# Patient Record
Sex: Male | Born: 1980
Health system: Southern US, Community
[De-identification: ages and names within clinical notes are randomized; demographics above are authoritative.]

## PROBLEM LIST (undated history)

## (undated) DIAGNOSIS — K56609 Unspecified intestinal obstruction, unspecified as to partial versus complete obstruction: Secondary | ICD-10-CM

## (undated) HISTORY — PX: BOWEL RESECTION: SHX1257

## (undated) HISTORY — PX: OTHER SURGICAL HISTORY: SHX169

## (undated) HISTORY — PX: KNEE ARTHROSCOPY: SUR90

## (undated) HISTORY — PX: ABDOMINAL SURGERY: SHX537

---

## 2001-04-10 ENCOUNTER — Emergency Department (HOSPITAL_COMMUNITY): Admission: EM | Admit: 2001-04-10 | Discharge: 2001-04-10 | Payer: Self-pay | Admitting: *Deleted

## 2001-04-10 ENCOUNTER — Encounter: Payer: Self-pay | Admitting: *Deleted

## 2001-08-05 ENCOUNTER — Emergency Department (HOSPITAL_COMMUNITY): Admission: EM | Admit: 2001-08-05 | Discharge: 2001-08-05 | Payer: Self-pay | Admitting: Emergency Medicine

## 2001-09-29 ENCOUNTER — Emergency Department (HOSPITAL_COMMUNITY): Admission: EM | Admit: 2001-09-29 | Discharge: 2001-09-29 | Payer: Self-pay | Admitting: Emergency Medicine

## 2002-02-12 ENCOUNTER — Emergency Department (HOSPITAL_COMMUNITY): Admission: EM | Admit: 2002-02-12 | Discharge: 2002-02-12 | Payer: Self-pay | Admitting: *Deleted

## 2002-02-12 ENCOUNTER — Encounter: Payer: Self-pay | Admitting: *Deleted

## 2002-04-29 ENCOUNTER — Emergency Department (HOSPITAL_COMMUNITY): Admission: EM | Admit: 2002-04-29 | Discharge: 2002-04-29 | Payer: Self-pay | Admitting: Emergency Medicine

## 2002-08-05 ENCOUNTER — Encounter: Payer: Self-pay | Admitting: Emergency Medicine

## 2002-08-05 ENCOUNTER — Emergency Department (HOSPITAL_COMMUNITY): Admission: EM | Admit: 2002-08-05 | Discharge: 2002-08-05 | Payer: Self-pay | Admitting: Emergency Medicine

## 2002-11-19 ENCOUNTER — Emergency Department (HOSPITAL_COMMUNITY): Admission: EM | Admit: 2002-11-19 | Discharge: 2002-11-19 | Payer: Self-pay | Admitting: Emergency Medicine

## 2002-12-02 ENCOUNTER — Emergency Department (HOSPITAL_COMMUNITY): Admission: EM | Admit: 2002-12-02 | Discharge: 2002-12-02 | Payer: Self-pay | Admitting: Emergency Medicine

## 2003-02-11 ENCOUNTER — Encounter: Payer: Self-pay | Admitting: Emergency Medicine

## 2003-02-11 ENCOUNTER — Emergency Department (HOSPITAL_COMMUNITY): Admission: EM | Admit: 2003-02-11 | Discharge: 2003-02-11 | Payer: Self-pay | Admitting: Internal Medicine

## 2003-03-29 ENCOUNTER — Emergency Department (HOSPITAL_COMMUNITY): Admission: EM | Admit: 2003-03-29 | Discharge: 2003-03-29 | Payer: Self-pay | Admitting: Internal Medicine

## 2004-07-04 ENCOUNTER — Inpatient Hospital Stay (HOSPITAL_COMMUNITY): Admission: EM | Admit: 2004-07-04 | Discharge: 2004-07-08 | Payer: Self-pay | Admitting: Emergency Medicine

## 2004-08-08 ENCOUNTER — Emergency Department (HOSPITAL_COMMUNITY): Admission: EM | Admit: 2004-08-08 | Discharge: 2004-08-09 | Payer: Self-pay | Admitting: Emergency Medicine

## 2004-11-13 ENCOUNTER — Emergency Department (HOSPITAL_COMMUNITY): Admission: EM | Admit: 2004-11-13 | Discharge: 2004-11-13 | Payer: Self-pay | Admitting: Emergency Medicine

## 2006-07-24 ENCOUNTER — Emergency Department (HOSPITAL_COMMUNITY): Admission: EM | Admit: 2006-07-24 | Discharge: 2006-07-24 | Payer: Self-pay | Admitting: Emergency Medicine

## 2007-10-26 ENCOUNTER — Emergency Department (HOSPITAL_COMMUNITY): Admission: EM | Admit: 2007-10-26 | Discharge: 2007-10-26 | Payer: Self-pay | Admitting: Emergency Medicine

## 2007-10-27 ENCOUNTER — Emergency Department (HOSPITAL_COMMUNITY): Admission: EM | Admit: 2007-10-27 | Discharge: 2007-10-27 | Payer: Self-pay | Admitting: Emergency Medicine

## 2011-01-16 NOTE — Consult Note (Signed)
Gordon [name], Gordon [name]           ACCOUNT NO.:  [id number]   MEDICAL RECORD NO.:  [id number]          PATIENT TYPE:  INP   LOCATION:  A312                          FACILITY:  [facility]   PHYSICIAN:  Scott A. Gerda Diss, MD    DATE OF BIRTH:  [birth year]   DATE OF CONSULTATION:  DATE OF DISCHARGE:  07/08/2004                                   CONSULTATION   DISCHARGE DIAGNOSES:  1.  Viral pneumonia.  2.  Hypoxemia.  3.  Asthma.   HOSPITAL COURSE:  A 30 year old black male with previous good history.  Was  admitted in with cough, congestion, wheezing, shortness of breath.  His O2  saturations were in the 80s.  He required oxygen.  Chest x-ray did show mild  what appeared to be most likely bilateral pneumonia consistent with  bilateral pneumonia.  His blood work showed a white count of 5.4, sodium  134, pO2 50, pCO2 47 on a blood gas.  He was given frequent nebulizer  treatments.  He was also placed on antibiotics.  CT scan of the chest was  done because a D-dimer was elevated but no pulmonary embolus was seen.  Patient gradually improved and on the 8th his O2 saturations were in good  range on room air.  He was discharged home on prednisone taper, albuterol  two puffs q.4h.  He was to follow up in one to two weeks and antibiotic  samples were given from the office.     [name]   SAL/MEDQ  D:  07/31/2004  T:  07/31/2004  Job:  [id number]

## 2011-01-16 NOTE — Group Therapy Note (Signed)
NAMESHANON, BECVAR           ACCOUNT NO.:  [id number]   MEDICAL RECORD NO.:  [id number]          PATIENT TYPE:  INP   LOCATION:  A312                          FACILITY:  [facility]   PHYSICIAN:  Scott A. Gerda Diss, MD    DATE OF BIRTH:  19-Apr-1981   DATE OF PROCEDURE:  DATE OF DISCHARGE:                                   PROGRESS NOTE   The patient is breathing better today.  Less coughing.  Feels more  energetic.  We will check an O2 saturation on room air.  Overall he is doing  well.  His lungs sound pretty good except for some scattered wheezing.  Expect for him to be able to be discharged home later today if all doing  well.     [name]   SAL/MEDQ  D:  07/07/2004  T:  07/07/2004  Job:  [id number]
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
## 2015-12-21 ENCOUNTER — Emergency Department (HOSPITAL_COMMUNITY): Payer: Self-pay

## 2015-12-21 ENCOUNTER — Observation Stay (HOSPITAL_COMMUNITY)
Admission: EM | Admit: 2015-12-21 | Discharge: 2015-12-22 | Disposition: A | Discharge status: Home / Self Care | Payer: Self-pay | Attending: Internal Medicine | Admitting: Internal Medicine

## 2015-12-21 ENCOUNTER — Encounter (HOSPITAL_COMMUNITY): Payer: Self-pay | Admitting: *Deleted

## 2015-12-21 DIAGNOSIS — K56609 Unspecified intestinal obstruction, unspecified as to partial versus complete obstruction: Secondary | ICD-10-CM

## 2015-12-21 DIAGNOSIS — E876 Hypokalemia: Secondary | ICD-10-CM

## 2015-12-21 DIAGNOSIS — K5660 Unspecified intestinal obstruction: Principal | ICD-10-CM | POA: Insufficient documentation

## 2015-12-21 DIAGNOSIS — Z791 Long term (current) use of non-steroidal anti-inflammatories (NSAID): Secondary | ICD-10-CM | POA: Insufficient documentation

## 2015-12-21 DIAGNOSIS — Z87891 Personal history of nicotine dependence: Secondary | ICD-10-CM | POA: Insufficient documentation

## 2015-12-21 DIAGNOSIS — K5669 Other intestinal obstruction: Secondary | ICD-10-CM

## 2015-12-21 LAB — COMPREHENSIVE METABOLIC PANEL
ALT: 167 U/L — ABNORMAL HIGH (ref 17–63)
AST: 76 U/L — AB (ref 15–41)
Albumin: 3.7 g/dL (ref 3.5–5.0)
Alkaline Phosphatase: 71 U/L (ref 38–126)
Anion gap: 10 (ref 5–15)
BUN: 8 mg/dL (ref 6–20)
CHLORIDE: 100 mmol/L — AB (ref 101–111)
CO2: 26 mmol/L (ref 22–32)
Calcium: 8.5 mg/dL — ABNORMAL LOW (ref 8.9–10.3)
Creatinine, Ser: 0.98 mg/dL (ref 0.61–1.24)
Glucose, Bld: 143 mg/dL — ABNORMAL HIGH (ref 65–99)
POTASSIUM: 2.9 mmol/L — AB (ref 3.5–5.1)
Sodium: 136 mmol/L (ref 135–145)
Total Bilirubin: 1 mg/dL (ref 0.3–1.2)
Total Protein: 8 g/dL (ref 6.5–8.1)

## 2015-12-21 LAB — CBC WITH DIFFERENTIAL/PLATELET
Basophils Absolute: 0.1 10*3/uL (ref 0.0–0.1)
Basophils Relative: 1 %
EOS PCT: 2 %
Eosinophils Absolute: 0.2 10*3/uL (ref 0.0–0.7)
HEMATOCRIT: 43.7 % (ref 39.0–52.0)
Hemoglobin: 15.3 g/dL (ref 13.0–17.0)
LYMPHS ABS: 1.6 10*3/uL (ref 0.7–4.0)
LYMPHS PCT: 19 %
MCH: 28.3 pg (ref 26.0–34.0)
MCHC: 35 g/dL (ref 30.0–36.0)
MCV: 80.9 fL (ref 78.0–100.0)
MONO ABS: 1.5 10*3/uL — AB (ref 0.1–1.0)
MONOS PCT: 17 %
NEUTROS ABS: 5.3 10*3/uL (ref 1.7–7.7)
Neutrophils Relative %: 61 %
PLATELETS: 217 10*3/uL (ref 150–400)
RBC: 5.4 MIL/uL (ref 4.22–5.81)
RDW: 12.8 % (ref 11.5–15.5)
WBC: 8.6 10*3/uL (ref 4.0–10.5)

## 2015-12-21 LAB — URINALYSIS, ROUTINE W REFLEX MICROSCOPIC
Bilirubin Urine: NEGATIVE
Glucose, UA: NEGATIVE mg/dL
Hgb urine dipstick: NEGATIVE
Ketones, ur: NEGATIVE mg/dL
LEUKOCYTES UA: NEGATIVE
Nitrite: NEGATIVE
Specific Gravity, Urine: 1.02 (ref 1.005–1.030)
pH: 6 (ref 5.0–8.0)

## 2015-12-21 LAB — MAGNESIUM: MAGNESIUM: 1.7 mg/dL (ref 1.7–2.4)

## 2015-12-21 LAB — URINE MICROSCOPIC-ADD ON

## 2015-12-21 LAB — LIPASE, BLOOD: LIPASE: 22 U/L (ref 11–51)

## 2015-12-21 MED ORDER — SENNOSIDES-DOCUSATE SODIUM 8.6-50 MG PO TABS: 1.0000 | ORAL_TABLET | Freq: Every evening | ORAL | Status: DC | PRN

## 2015-12-21 MED ORDER — ENOXAPARIN SODIUM 60 MG/0.6ML ~~LOC~~ SOLN
60.0000 mg | SUBCUTANEOUS | Status: DC
  Administered 2015-12-21: 60 mg via SUBCUTANEOUS
  Filled 2015-12-21 (×2): qty 0.6

## 2015-12-21 MED ORDER — POTASSIUM CHLORIDE 10 MEQ/100ML IV SOLN
10.0000 meq | INTRAVENOUS | Status: AC
  Administered 2015-12-21 (×5): 10 meq via INTRAVENOUS
  Filled 2015-12-21: qty 100

## 2015-12-21 MED ORDER — LOPERAMIDE HCL 2 MG PO CAPS
4.0000 mg | ORAL_CAPSULE | Freq: Once | ORAL | Status: AC
  Administered 2015-12-21: 4 mg via ORAL
  Filled 2015-12-21: qty 2

## 2015-12-21 MED ORDER — IOPAMIDOL (ISOVUE-300) INJECTION 61%
INTRAVENOUS | Status: AC
  Administered 2015-12-21: 100 mL
  Filled 2015-12-21: qty 100

## 2015-12-21 MED ORDER — SODIUM CHLORIDE 0.9 % IV BOLUS (SEPSIS)
1000.0000 mL | Freq: Once | INTRAVENOUS | Status: AC
  Administered 2015-12-21: 1000 mL via INTRAVENOUS

## 2015-12-21 MED ORDER — DIATRIZOATE MEGLUMINE & SODIUM 66-10 % PO SOLN
ORAL | Status: AC
  Filled 2015-12-21: qty 30

## 2015-12-21 MED ORDER — SODIUM CHLORIDE 0.9 % IV SOLN
INTRAVENOUS | Status: DC
  Administered 2015-12-21 – 2015-12-22 (×3): via INTRAVENOUS

## 2015-12-21 MED ORDER — MORPHINE SULFATE (PF) 2 MG/ML IV SOLN: 1.0000 mg | INTRAVENOUS | Status: DC | PRN

## 2015-12-21 MED ORDER — ONDANSETRON HCL 4 MG/2ML IJ SOLN
4.0000 mg | Freq: Four times a day (QID) | INTRAMUSCULAR | Status: DC | PRN
  Administered 2015-12-22: 4 mg via INTRAVENOUS
  Filled 2015-12-21: qty 2

## 2015-12-21 MED ORDER — POTASSIUM CHLORIDE 10 MEQ/100ML IV SOLN
10.0000 meq | Freq: Once | INTRAVENOUS | Status: AC
  Administered 2015-12-21: 10 meq via INTRAVENOUS
  Filled 2015-12-21: qty 100

## 2015-12-21 MED ORDER — ONDANSETRON HCL 4 MG/2ML IJ SOLN
4.0000 mg | Freq: Once | INTRAMUSCULAR | Status: AC
  Administered 2015-12-21: 4 mg via INTRAVENOUS
  Filled 2015-12-21: qty 2

## 2015-12-21 MED ORDER — ONDANSETRON HCL 4 MG PO TABS: 4.0000 mg | ORAL_TABLET | Freq: Four times a day (QID) | ORAL | Status: DC | PRN

## 2015-12-21 MED ORDER — ZOLPIDEM TARTRATE 5 MG PO TABS
5.0000 mg | ORAL_TABLET | Freq: Every evening | ORAL | Status: DC | PRN
  Administered 2015-12-21: 5 mg via ORAL
  Filled 2015-12-21: qty 1
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
## 2015-12-21 NOTE — ED Notes (Signed)
Attempted to call report to RN, per unit secretary nurse unable to take report. 
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
## 2015-12-21 NOTE — ED Notes (Signed)
Pt c/o diarrhea that started Monday and Thursday he began having n/v as well. Pt unable to keep anything down.

## 2015-12-21 NOTE — Progress Notes (Signed)
1939 requesting sleep medication, mid-level notified.

## 2015-12-21 NOTE — Progress Notes (Signed)
Received report from Mindy in ED regarding patient coming to rm [id number]
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
## 2015-12-22 ENCOUNTER — Observation Stay (HOSPITAL_COMMUNITY): Payer: Self-pay

## 2015-12-22 LAB — BASIC METABOLIC PANEL
ANION GAP: 9 (ref 5–15)
BUN: 6 mg/dL (ref 6–20)
CO2: 28 mmol/L (ref 22–32)
Calcium: 8.5 mg/dL — ABNORMAL LOW (ref 8.9–10.3)
Chloride: 102 mmol/L (ref 101–111)
Creatinine, Ser: 0.9 mg/dL (ref 0.61–1.24)
GLUCOSE: 113 mg/dL — AB (ref 65–99)
POTASSIUM: 3.2 mmol/L — AB (ref 3.5–5.1)
Sodium: 139 mmol/L (ref 135–145)

## 2015-12-22 LAB — CBC
HEMATOCRIT: 39 % (ref 39.0–52.0)
HEMOGLOBIN: 13.3 g/dL (ref 13.0–17.0)
MCH: 27.7 pg (ref 26.0–34.0)
MCHC: 34.1 g/dL (ref 30.0–36.0)
MCV: 81.1 fL (ref 78.0–100.0)
Platelets: 167 10*3/uL (ref 150–400)
RBC: 4.81 MIL/uL (ref 4.22–5.81)
RDW: 12.5 % (ref 11.5–15.5)
WBC: 5.5 10*3/uL (ref 4.0–10.5)

## 2015-12-22 MED ORDER — ONDANSETRON HCL 4 MG PO TABS: 4.0000 mg | ORAL_TABLET | Freq: Four times a day (QID) | ORAL | Status: DC | PRN
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
## 2015-12-22 NOTE — Discharge Instructions (Signed)
Small Bowel Obstruction A small bowel obstruction means that something is blocking the small bowel. The small bowel is also called the small intestine. It is the long tube that connects the stomach to the colon. An obstruction will stop food and fluids from passing through the small bowel. Treatment depends on what is causing the problem and how bad the problem is. HOME CARE  Get a lot of rest.  Follow your diet as told by your doctor. You may need to:  Only drink clear liquids until you start to get better.  Avoid solid foods as told by your doctor.  Take over-the-counter and prescription medicines only as told by your doctor.  Keep all follow-up visits as told by your doctor. This is important. GET HELP IF:  You have a fever.  You have chills. GET HELP RIGHT AWAY IF:  You have pain or cramps that get worse.  You throw up (vomit) blood.  You have a feeling of being sick to your stomach (nausea) that does not go away.  You cannot stop throwing up.  You cannot drink fluids.  You feel confused.  You feel dry or thirsty (dehydrated).  Your belly gets more bloated.  You feel weak or you pass out (faint).   This information is not intended to replace advice given to you by your health care provider. Make sure you discuss any questions you have with your health care provider.   Document Released: 09/24/2004 Document Revised: 05/08/2015 Document Reviewed: 10/11/2014 Elsevier Interactive Patient Education [employer]vier Inc.  Returning to the hospital immediately if you develop increased abdominal pain, fever, nausea and vomiting or unable to have a bowel movement.

## 2015-12-22 NOTE — Progress Notes (Signed)
Subjective: Feels much better. Passing flatus. Minimal nausea. Tolerating clear liquid diet well.  Objective: Vital signs in last 24 hours: Temp:  [98 F (36.7 C)-98.3 F (36.8 C)] 98 F (36.7 C) (04/23 0659) Pulse Rate:  [68-90] 68 (04/23 0659) Resp:  [18-20] 20 (04/23 0659) BP: (112-127)/(52-74) 112/52 mmHg (04/23 0659) SpO2:  [97 %-99 %] 98 % (04/23 0659) Last BM Date: 12/21/15  Intake/Output from previous day: 04/22 0701 - 04/23 0700 In: 1298.8 [P.O.:600; I.V.:298.8; IV Piggyback:400] Out: -  Intake/Output this shift: Total I/O In: 360 [P.O.:360] Out: -   General appearance: alert, cooperative and no distress GI: soft, non-tender; bowel sounds normal; no masses,  no organomegaly  Lab Results:   Recent Labs  12/21/15 0343 12/22/15 0545  WBC 8.6 5.5  HGB 15.3 13.3  HCT 43.7 39.0  PLT 217 167   BMET  Recent Labs  12/21/15 0343 12/22/15 0545  NA 136 139  K 2.9* 3.2*  CL 100* 102  CO2 26 28  GLUCOSE 143* 113*  BUN 8 6  CREATININE 0.98 0.90  CALCIUM 8.5* 8.5*   PT/INR No results for input(s): LABPROT, INR in the last 72 hours.  Studies/Results: Dg Abd 1 View  12/21/2015  CLINICAL DATA:  Diarrhea starting Monday. Nausea and vomiting beginning on Thursday. EXAM: ABDOMEN - 1 VIEW COMPARISON:  None. FINDINGS: Gas-filled distended mid abdominal small bowel may indicate obstruction or enteritis. Scattered gas and stool throughout the colon without abnormal distention. No radiopaque stones. Visualized bones appear intact. IMPRESSION: Single gas distended small bowel loop in the mid abdomen could represent obstruction or enteritis. Electronically Signed   By: Burman Nieves M.D.   On: 12/21/2015 04:57   Ct Abdomen Pelvis W Contrast  12/21/2015  CLINICAL DATA:  Diarrhea since Monday. Nausea and vomiting. Possible obstruction on plain radiographs. EXAM: CT ABDOMEN AND PELVIS WITH CONTRAST TECHNIQUE: Multidetector CT imaging of the abdomen and pelvis was  performed using the standard protocol following bolus administration of intravenous contrast. CONTRAST:  ISOVUE-300 IOPAMIDOL (ISOVUE-300) INJECTION 61% COMPARISON:  Abdomen 12/21/2015 FINDINGS: The lung bases are clear. The liver, spleen, gallbladder, pancreas, adrenal glands, kidneys, abdominal aorta, and retroperitoneal lymph nodes are unremarkable. There is variant anatomy in the inferior vena cava with [name] persistent left inferior vena cava extending to the left renal artery and an continuing to the intrahepatic portion of the vena cava. The typical right vena cava is not present. Stomach is not abnormally distended. Dilated proximal small bowel with focal areas of wall thickening in the small bowel. Transition zone to normal caliber small bowel in the anterior mid abdomen. Contrast material flows through this area into the distal small bowel and to the colon. Appearance is consistent with partial small bowel obstruction. No specific mass is demonstrated at the transition zone. Presence of wall thickening likely indicates inflammatory stricture, although adhesions could also have this appearance. Consider possibility of Crohn's disease. Colon is not abnormally distended. There is scattered stool and liquid stool in the colon. No colonic wall thickening is appreciated. Prominent mesenteric lymph nodes without pathologic enlargement, likely reactive. No free air or free fluid in the abdomen. Scarring in the anterior abdominal wall consistent with previous abdominal surgery. Small fat herniation to the right of the umbilicus. Pelvis: Prostate gland is not enlarged. Bladder wall is not thickened. No free or loculated pelvic fluid collections. No pelvic mass or lymphadenopathy. The appendix is normal. No destructive bone lesions. Degenerative changes in the lower thoracic spine. IMPRESSION: Changes  of partial small bowel obstruction with transition zone in the right anterior abdomen. There [name] few focal areas that  suggest small bowel wall thickening possibly representing inflammatory process. An adhesion could also have this appearance. Probable reactive mesenteric lymph nodes. Incidental note of variant anatomy in the inferior vena cava with [name] persistent left IVC. Electronically Signed   By: Burman NievesWilliam  Stevens M.D.   On: 12/21/2015 06:52    Anti-infectives: Anti-infectives    None      Assessment/Plan: Impression: Abdominal distention, resolving. KUB done this morning shows gas throughout small and large intestine. Doubt surgical bowel obstruction. Plan: We'll advance to soft diet. May be discharged from surgery standpoint. No follow-up needed unless symptoms recur.     Gordon [name],Gordon [name] 12/22/2015

## 2017-08-24 ENCOUNTER — Encounter (HOSPITAL_COMMUNITY): Payer: Self-pay | Admitting: Emergency Medicine

## 2017-08-24 ENCOUNTER — Emergency Department (HOSPITAL_COMMUNITY)
Admission: EM | Admit: 2017-08-24 | Discharge: 2017-08-24 | Disposition: A | Discharge status: Home / Self Care | Payer: BLUE CROSS/BLUE SHIELD | Attending: Emergency Medicine | Admitting: Emergency Medicine

## 2017-08-24 DIAGNOSIS — R112 Nausea with vomiting, unspecified: Secondary | ICD-10-CM

## 2017-08-24 DIAGNOSIS — I10 Essential (primary) hypertension: Secondary | ICD-10-CM

## 2017-08-24 DIAGNOSIS — Z87891 Personal history of nicotine dependence: Secondary | ICD-10-CM | POA: Diagnosis not present

## 2017-08-24 DIAGNOSIS — R197 Diarrhea, unspecified: Secondary | ICD-10-CM | POA: Insufficient documentation

## 2017-08-24 HISTORY — DX: Unspecified intestinal obstruction, unspecified as to partial versus complete obstruction: K56.609

## 2017-08-24 MED ORDER — HYDROCHLOROTHIAZIDE 25 MG PO TABS: 25.0000 mg | ORAL_TABLET | Freq: Every day | ORAL | 1 refills | Status: DC

## 2017-08-24 MED ORDER — SODIUM CHLORIDE 0.9 % IV BOLUS (SEPSIS)
1000.0000 mL | Freq: Once | INTRAVENOUS | Status: AC
  Administered 2017-08-24: 1000 mL via INTRAVENOUS

## 2017-08-24 MED ORDER — ONDANSETRON 4 MG PO TBDP
4.0000 mg | ORAL_TABLET | Freq: Once | ORAL | Status: AC
  Administered 2017-08-24: 4 mg via ORAL
  Filled 2017-08-24: qty 1

## 2017-08-24 MED ORDER — PROCHLORPERAZINE EDISYLATE 5 MG/ML IJ SOLN
10.0000 mg | Freq: Once | INTRAMUSCULAR | Status: AC
  Administered 2017-08-24: 10 mg via INTRAVENOUS
  Filled 2017-08-24: qty 2

## 2017-08-24 MED ORDER — ONDANSETRON 4 MG PO TBDP: 4.0000 mg | ORAL_TABLET | Freq: Three times a day (TID) | ORAL | 0 refills | Status: DC | PRN

## 2017-08-24 NOTE — ED Triage Notes (Signed)
Pt reports waking with n/v/d.  States stomach is cramping through the center.
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
## 2017-08-24 NOTE — ED Notes (Signed)
Pt feeling much better.

## 2017-08-24 NOTE — ED Notes (Signed)
Tech going to take repeat vitals

## 2017-08-24 NOTE — Discharge Instructions (Addendum)
Zofran every 6 hours as needed  Please obtain all of your results from medical records or have your doctors office obtain the results - share them with your doctor - you should be seen at your doctors office in the next 2 days. Call today to arrange your follow up. Take the medications as prescribed. Please review all of the medicines and only take them if you do not have an allergy to them. Please be aware that if you are taking birth control pills, taking other prescriptions, ESPECIALLY ANTIBIOTICS may make the birth control ineffective - if this is the case, either do not engage in sexual activity or use alternative methods of birth control such as condoms until you have finished the medicine and your family doctor says it is OK to restart them. If you are on a blood thinner such as COUMADIN, be aware that any other medicine that you take may cause the coumadin to either work too much, or not enough - you should have your coumadin level rechecked in next 7 days if this is the case.  ?  It is also a possibility that you have an allergic reaction to any of the medicines that you have been prescribed - Everybody reacts differently to medications and while MOST people have no trouble with most medicines, you may have a reaction such as nausea, vomiting, rash, swelling, shortness of breath. If this is the case, please stop taking the medicine immediately and contact your physician.  ?  You should return to the ER if you develop severe or worsening symptoms.    Your blood pressure has been elevated since her last visit to the emergency department last year.  It is significantly elevated and will need to have blood pressure medication started.  I have prescribed hydrochlorothiazide 25 mg tablets.  Take once a day, this will make you urinate more frequently so you may want to take in the morning.  You must follow-up with your doctor within 2 weeks for a recheck of your blood pressure.  Return to the emergency  department for worsening symptoms

## 2018-04-08 ENCOUNTER — Other Ambulatory Visit: Payer: Self-pay

## 2018-04-08 ENCOUNTER — Encounter (HOSPITAL_COMMUNITY): Payer: Self-pay | Admitting: Emergency Medicine

## 2018-04-08 ENCOUNTER — Emergency Department (HOSPITAL_COMMUNITY)
Admission: EM | Admit: 2018-04-08 | Discharge: 2018-04-08 | Disposition: A | Discharge status: Home / Self Care | Payer: BLUE CROSS/BLUE SHIELD | Attending: Emergency Medicine | Admitting: Emergency Medicine

## 2018-04-08 DIAGNOSIS — G8929 Other chronic pain: Secondary | ICD-10-CM | POA: Insufficient documentation

## 2018-04-08 DIAGNOSIS — Z79899 Other long term (current) drug therapy: Secondary | ICD-10-CM | POA: Insufficient documentation

## 2018-04-08 DIAGNOSIS — F17200 Nicotine dependence, unspecified, uncomplicated: Secondary | ICD-10-CM | POA: Insufficient documentation

## 2018-04-08 DIAGNOSIS — M545 Low back pain: Secondary | ICD-10-CM | POA: Diagnosis present

## 2018-04-08 MED ORDER — DEXAMETHASONE 4 MG PO TABS: 4.0000 mg | ORAL_TABLET | Freq: Two times a day (BID) | ORAL | 0 refills | Status: DC

## 2018-04-08 MED ORDER — CYCLOBENZAPRINE HCL 10 MG PO TABS: 10.0000 mg | ORAL_TABLET | Freq: Three times a day (TID) | ORAL | 0 refills | Status: DC

## 2018-04-08 NOTE — Discharge Instructions (Addendum)
Your vital signs within normal limits.  Your examination and your history suggest degenerative disc disease changes involving her back.  I feel that you would be best served with an MRI of your back which would be ordered by your primary physician or a specialist.  Heating pad to your back will be helpful.  Please rest her back is much as possible.  Discussed MRI and other diagnostics and therapies with your primary physician.  Use Flexeril 3 times daily for spasm pain.  This medication will cause drowsiness.  Please do not drive a vehicle, operate machinery, handle illegal documents, drink alcohol, or participate in activities requiring concentration when taking this medication.  Please use Decadron 2 times daily, and extra strength Tylenol every 4 hours for pain and discomfort until you are seen by your primary physician.

## 2018-04-08 NOTE — ED Triage Notes (Signed)
Patient complaining of lower back pain "for several months." States he also has tingling to bilateral hands and feet "for months also." Denies injury. States he has appt with Dr Sherril CroonVyas on 8/19.
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
## 2019-02-18 ENCOUNTER — Encounter (HOSPITAL_COMMUNITY): Payer: Self-pay | Admitting: Emergency Medicine

## 2019-02-18 ENCOUNTER — Other Ambulatory Visit: Payer: Self-pay

## 2019-02-18 ENCOUNTER — Emergency Department (HOSPITAL_COMMUNITY): Payer: BC Managed Care – PPO

## 2019-02-18 ENCOUNTER — Emergency Department (HOSPITAL_COMMUNITY)
Admission: EM | Admit: 2019-02-18 | Discharge: 2019-02-18 | Disposition: A | Discharge status: Home / Self Care | Payer: BC Managed Care – PPO | Attending: Emergency Medicine | Admitting: Emergency Medicine

## 2019-02-18 DIAGNOSIS — R1011 Right upper quadrant pain: Secondary | ICD-10-CM

## 2019-02-18 DIAGNOSIS — Z20828 Contact with and (suspected) exposure to other viral communicable diseases: Secondary | ICD-10-CM | POA: Insufficient documentation

## 2019-02-18 DIAGNOSIS — K819 Cholecystitis, unspecified: Secondary | ICD-10-CM | POA: Diagnosis not present

## 2019-02-18 DIAGNOSIS — Z87891 Personal history of nicotine dependence: Secondary | ICD-10-CM | POA: Diagnosis not present

## 2019-02-18 LAB — CBC WITH DIFFERENTIAL/PLATELET
Abs Immature Granulocytes: 0.04 10*3/uL (ref 0.00–0.07)
Basophils Absolute: 0 10*3/uL (ref 0.0–0.1)
Basophils Relative: 0 %
Eosinophils Absolute: 0.2 10*3/uL (ref 0.0–0.5)
Eosinophils Relative: 1 %
HCT: 46.3 % (ref 39.0–52.0)
Hemoglobin: 15 g/dL (ref 13.0–17.0)
Immature Granulocytes: 0 %
Lymphocytes Relative: 10 %
Lymphs Abs: 1.3 10*3/uL (ref 0.7–4.0)
MCH: 27 pg (ref 26.0–34.0)
MCHC: 32.4 g/dL (ref 30.0–36.0)
MCV: 83.4 fL (ref 80.0–100.0)
Monocytes Absolute: 0.6 10*3/uL (ref 0.1–1.0)
Monocytes Relative: 5 %
Neutro Abs: 10.6 10*3/uL — ABNORMAL HIGH (ref 1.7–7.7)
Neutrophils Relative %: 84 %
Platelets: 247 10*3/uL (ref 150–400)
RBC: 5.55 MIL/uL (ref 4.22–5.81)
RDW: 13.8 % (ref 11.5–15.5)
WBC: 12.7 10*3/uL — ABNORMAL HIGH (ref 4.0–10.5)
nRBC: 0 % (ref 0.0–0.2)

## 2019-02-18 LAB — COMPREHENSIVE METABOLIC PANEL
ALT: 40 U/L (ref 0–44)
AST: 35 U/L (ref 15–41)
Albumin: 4.4 g/dL (ref 3.5–5.0)
Alkaline Phosphatase: 53 U/L (ref 38–126)
Anion gap: 7 (ref 5–15)
BUN: 9 mg/dL (ref 6–20)
CO2: 28 mmol/L (ref 22–32)
Calcium: 9.7 mg/dL (ref 8.9–10.3)
Chloride: 99 mmol/L (ref 98–111)
Creatinine, Ser: 0.83 mg/dL (ref 0.61–1.24)
GFR calc Af Amer: >60 mL/min (ref >60)
GFR calc non Af Amer: >60 mL/min (ref >60)
Glucose, Bld: 138 mg/dL — ABNORMAL HIGH (ref 70–99)
Potassium: 3.9 mmol/L (ref 3.5–5.1)
Sodium: 134 mmol/L — ABNORMAL LOW (ref 135–145)
Total Bilirubin: 0.5 mg/dL (ref 0.3–1.2)
Total Protein: 8.3 g/dL — ABNORMAL HIGH (ref 6.5–8.1)

## 2019-02-18 LAB — URINALYSIS, ROUTINE W REFLEX MICROSCOPIC
Bilirubin Urine: NEGATIVE
Glucose, UA: NEGATIVE mg/dL
Hgb urine dipstick: NEGATIVE
Ketones, ur: NEGATIVE mg/dL
Leukocytes,Ua: NEGATIVE
Nitrite: NEGATIVE
Protein, ur: NEGATIVE mg/dL
Specific Gravity, Urine: >1.046 — ABNORMAL HIGH (ref 1.005–1.030)
pH: 7 (ref 5.0–8.0)

## 2019-02-18 LAB — LIPASE, BLOOD: Lipase: 23 U/L (ref 11–51)

## 2019-02-18 MED ORDER — OXYCODONE-ACETAMINOPHEN 5-325 MG PO TABS: 1.0000 | ORAL_TABLET | ORAL | 0 refills | Status: DC | PRN

## 2019-02-18 MED ORDER — IOHEXOL 300 MG/ML  SOLN
100.0000 mL | Freq: Once | INTRAMUSCULAR | Status: AC | PRN
  Administered 2019-02-18: 100 mL via INTRAVENOUS

## 2019-02-18 MED ORDER — PANTOPRAZOLE SODIUM 40 MG IV SOLR
40.0000 mg | Freq: Once | INTRAVENOUS | Status: AC
  Administered 2019-02-18: 40 mg via INTRAVENOUS
  Filled 2019-02-18: qty 40

## 2019-02-18 MED ORDER — HYDROMORPHONE HCL 1 MG/ML IJ SOLN
1.0000 mg | Freq: Once | INTRAMUSCULAR | Status: AC
  Administered 2019-02-18: 1 mg via INTRAVENOUS
  Filled 2019-02-18: qty 1

## 2019-02-18 MED ORDER — CIPROFLOXACIN HCL 500 MG PO TABS: 500.0000 mg | ORAL_TABLET | Freq: Two times a day (BID) | ORAL | 0 refills | Status: DC

## 2019-02-18 MED ORDER — SODIUM CHLORIDE 0.9 % IV BOLUS
1000.0000 mL | Freq: Once | INTRAVENOUS | Status: AC
  Administered 2019-02-18: 1000 mL via INTRAVENOUS

## 2019-02-18 MED ORDER — ONDANSETRON HCL 4 MG/2ML IJ SOLN
4.0000 mg | Freq: Once | INTRAMUSCULAR | Status: AC
  Administered 2019-02-18: 4 mg via INTRAVENOUS
  Filled 2019-02-18: qty 2

## 2019-02-18 MED ORDER — CIPROFLOXACIN HCL 250 MG PO TABS
500.0000 mg | ORAL_TABLET | Freq: Once | ORAL | Status: AC
  Administered 2019-02-18: 15:00:00 500 mg via ORAL
  Filled 2019-02-18: qty 2

## 2019-02-18 MED ORDER — ONDANSETRON HCL 4 MG PO TABS: 4.0000 mg | ORAL_TABLET | Freq: Four times a day (QID) | ORAL | 0 refills | Status: DC
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
## 2019-02-18 NOTE — Discharge Instructions (Addendum)
Bland diet.  Take the antibiotic as directed.  Dr. Arnoldo Morale will see you in his office on Tuesday 6/ 23/20 at 1:30 pm.  As discussed, return to ER for any worsening symptoms such as increasing pain, fever and vomiting

## 2019-02-18 NOTE — ED Notes (Signed)
IVF running slower, patent.

## 2019-02-21 LAB — NOVEL CORONAVIRUS, NAA (HOSP ORDER, SEND-OUT TO REF LAB; TAT 18-24 HRS): SARS-CoV-2, NAA: NOT DETECTED

## 2019-02-23 ENCOUNTER — Other Ambulatory Visit: Payer: Self-pay

## 2019-02-23 ENCOUNTER — Encounter: Payer: Self-pay | Admitting: General Surgery

## 2019-02-23 ENCOUNTER — Ambulatory Visit (INDEPENDENT_AMBULATORY_CARE_PROVIDER_SITE_OTHER): Payer: BC Managed Care – PPO | Admitting: General Surgery

## 2019-02-23 VITALS — BP 148/96 | HR 69 | Temp 97.5°F | Resp 16 | Ht 69.0 in | Wt 322.0 lb

## 2019-02-23 DIAGNOSIS — K8000 Calculus of gallbladder with acute cholecystitis without obstruction: Secondary | ICD-10-CM | POA: Diagnosis not present
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
## 2019-02-23 NOTE — Patient Instructions (Addendum)
Open Cholecystectomy Open cholecystectomy is surgery to remove the gallbladder. The gallbladder is a pear-shaped organ that lies beneath the liver on the right side of the body. The gallbladder stores bile, which is a fluid that helps the body to digest fats. Cholecystectomy is often done for inflammation of the gallbladder (cholecystitis). This condition is usually caused by a buildup of gallstones (cholelithiasis) in the gallbladder. Gallstones can block the flow of bile, which can result in inflammation and pain. In severe cases, emergency surgery may be required. Tell a health care provider about:  Any allergies you have.  All medicines you are taking, including vitamins, herbs, eye drops, creams, and over-the-counter medicines.  Any problems you or family members have had with anesthetic medicines.  Any blood disorders you have.  Any surgeries you have had.  Any medical conditions you have.  Whether you are pregnant or may be pregnant. What are the risks? Generally, this is a safe procedure. However, problems may occur, including:  Infection.  Bleeding.  Allergic reactions to medicines.  Damage to other structures or organs.  A stone remaining in the common bile duct. The common bile duct carries bile from the gallbladder into the small intestine.  A bile leak from the cyst duct that is clipped when your gallbladder is removed. What happens before the procedure?   Medicines  Ask your health care provider about: ? Changing or stopping your regular medicines. This is especially important if you are taking diabetes medicines or blood thinners. ? Taking medicines such as aspirin and ibuprofen. These medicines can thin your blood. Do not take these medicines before your procedure if your health care provider instructs you not to.  You may be given antibiotic medicine to help prevent infection. General instructions  Let your health care provider know if you develop a cold or  an infection before surgery.  Plan to have someone take you home from the hospital or clinic.  Ask your health care provider how your surgical site will be marked or identified. What happens during the procedure?   To reduce your risk of infection: ? Your health care team will wash or sanitize their hands. ? Your skin will be washed with soap. ? Hair may be removed from the surgical area.  An IV tube may be inserted into one of your veins.  You will be given one or more of the following: ? A medicine to help you relax (sedative). ? A medicine to make you fall asleep (general anesthetic).  A breathing tube will be placed in your mouth.  Your surgeon will make a cut (incision) in the upper abdomen to access your gallbladder.  Your gallbladder will be removed.  Your common bile duct may be examined. If stones are found in the common bile duct, they may be removed.  After your gallbladder has been removed, the incisions will be closed with stitches (sutures), skin glue, or staples.  Your incision will be covered with a bandage (dressing). The procedure may vary among health care providers and hospitals. What happens after the procedure?  Your blood pressure, heart rate, breathing rate, and blood oxygen level will be monitored until the medicines you were given have worn off.  You will be given medicines as needed to control your pain.  Do not drive for 24 hours if you were given a sedative. This information is not intended to replace advice given to you by your health care provider. Make sure you discuss any questions you have  with your health care provider. Document Released: 05/09/2002 Document Revised: 07/15/2017 Document Reviewed: 02/03/2016 Elsevier Interactive Patient Education  2019 [employer].
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
## 2019-03-08 ENCOUNTER — Encounter (HOSPITAL_COMMUNITY)
Admission: RE | Admit: 2019-03-08 | Discharge: 2019-03-08 | Disposition: A | Discharge status: Home / Self Care | Payer: Self-pay | Source: Ambulatory Visit | Attending: General Surgery | Admitting: General Surgery

## 2019-03-08 ENCOUNTER — Other Ambulatory Visit: Payer: Self-pay

## 2019-03-08 ENCOUNTER — Encounter (HOSPITAL_COMMUNITY): Payer: Self-pay

## 2019-03-09 ENCOUNTER — Other Ambulatory Visit (HOSPITAL_COMMUNITY)
Admission: RE | Admit: 2019-03-09 | Discharge: 2019-03-09 | Disposition: A | Discharge status: Home / Self Care | Payer: HRSA Program | Source: Ambulatory Visit | Attending: General Surgery | Admitting: General Surgery

## 2019-03-09 DIAGNOSIS — Z1159 Encounter for screening for other viral diseases: Secondary | ICD-10-CM | POA: Insufficient documentation

## 2019-03-09 DIAGNOSIS — Z01812 Encounter for preprocedural laboratory examination: Secondary | ICD-10-CM | POA: Insufficient documentation

## 2019-03-10 LAB — SARS CORONAVIRUS 2 (TAT 6-24 HRS): SARS Coronavirus 2: NEGATIVE

## 2019-03-13 ENCOUNTER — Observation Stay (HOSPITAL_COMMUNITY): Payer: Self-pay | Admitting: Anesthesiology

## 2019-03-13 ENCOUNTER — Encounter (HOSPITAL_COMMUNITY)
Admission: RE | Disposition: A | Discharge status: Home / Self Care | Payer: Self-pay | Source: Home / Self Care | Attending: General Surgery

## 2019-03-13 ENCOUNTER — Encounter (HOSPITAL_COMMUNITY): Payer: Self-pay | Admitting: *Deleted

## 2019-03-13 ENCOUNTER — Inpatient Hospital Stay (HOSPITAL_COMMUNITY)
Admission: RE | Admit: 2019-03-13 | Discharge: 2019-03-15 | DRG: 415 | Disposition: A | Discharge status: Home / Self Care | Payer: Self-pay | Attending: General Surgery | Admitting: General Surgery

## 2019-03-13 ENCOUNTER — Other Ambulatory Visit: Payer: Self-pay

## 2019-03-13 DIAGNOSIS — K8012 Calculus of gallbladder with acute and chronic cholecystitis without obstruction: Principal | ICD-10-CM | POA: Diagnosis present

## 2019-03-13 DIAGNOSIS — K8020 Calculus of gallbladder without cholecystitis without obstruction: Secondary | ICD-10-CM

## 2019-03-13 DIAGNOSIS — Z9049 Acquired absence of other specified parts of digestive tract: Secondary | ICD-10-CM

## 2019-03-13 DIAGNOSIS — Z87891 Personal history of nicotine dependence: Secondary | ICD-10-CM

## 2019-03-13 DIAGNOSIS — Z6841 Body Mass Index (BMI) 40.0 and over, adult: Secondary | ICD-10-CM

## 2019-03-13 HISTORY — PX: CHOLECYSTECTOMY: SHX55

## 2019-03-13 SURGERY — CHOLECYSTECTOMY
Anesthesia: General

## 2019-03-13 MED ORDER — METHOCARBAMOL 500 MG PO TABS
500.0000 mg | ORAL_TABLET | Freq: Four times a day (QID) | ORAL | Status: DC | PRN
  Administered 2019-03-13 – 2019-03-14 (×2): 500 mg via ORAL
  Filled 2019-03-13 (×2): qty 1

## 2019-03-13 MED ORDER — CEFAZOLIN SODIUM-DEXTROSE 1-4 GM/50ML-% IV SOLN
INTRAVENOUS | Status: AC
  Filled 2019-03-13: qty 50

## 2019-03-13 MED ORDER — OXYCODONE HCL 5 MG/5ML PO SOLN: 5.0000 mg | Freq: Once | ORAL | Status: DC | PRN

## 2019-03-13 MED ORDER — DEXAMETHASONE SODIUM PHOSPHATE 10 MG/ML IJ SOLN
INTRAMUSCULAR | Status: DC | PRN
  Administered 2019-03-13: 10 mg via INTRAVENOUS

## 2019-03-13 MED ORDER — SODIUM CHLORIDE 0.9 % IV SOLN
INTRAVENOUS | Status: DC
  Administered 2019-03-13 – 2019-03-14 (×2): via INTRAVENOUS

## 2019-03-13 MED ORDER — POVIDONE-IODINE 10 % EX OINT
TOPICAL_OINTMENT | CUTANEOUS | Status: AC
  Filled 2019-03-13: qty 1

## 2019-03-13 MED ORDER — KETOROLAC TROMETHAMINE 30 MG/ML IJ SOLN
30.0000 mg | Freq: Four times a day (QID) | INTRAMUSCULAR | Status: AC
  Administered 2019-03-13: 30 mg via INTRAVENOUS
  Filled 2019-03-13: qty 1

## 2019-03-13 MED ORDER — SIMETHICONE 80 MG PO CHEW: 40.0000 mg | CHEWABLE_TABLET | Freq: Four times a day (QID) | ORAL | Status: DC | PRN

## 2019-03-13 MED ORDER — 0.9 % SODIUM CHLORIDE (POUR BTL) OPTIME
TOPICAL | Status: DC | PRN
  Administered 2019-03-13: 09:00:00 2000 mL

## 2019-03-13 MED ORDER — HEMOSTATIC AGENTS (NO CHARGE) OPTIME
TOPICAL | Status: DC | PRN
  Administered 2019-03-13 (×2): 1 via TOPICAL

## 2019-03-13 MED ORDER — ONDANSETRON HCL 4 MG/2ML IJ SOLN
INTRAMUSCULAR | Status: AC
  Filled 2019-03-13: qty 2

## 2019-03-13 MED ORDER — DEXAMETHASONE SODIUM PHOSPHATE 10 MG/ML IJ SOLN
INTRAMUSCULAR | Status: AC
  Filled 2019-03-13: qty 1

## 2019-03-13 MED ORDER — OXYCODONE HCL 5 MG PO TABS: 5.0000 mg | ORAL_TABLET | Freq: Once | ORAL | Status: DC | PRN

## 2019-03-13 MED ORDER — KETOROLAC TROMETHAMINE 30 MG/ML IJ SOLN
INTRAMUSCULAR | Status: AC
  Filled 2019-03-13: qty 1

## 2019-03-13 MED ORDER — LACTATED RINGERS IV SOLN
INTRAVENOUS | Status: DC
  Administered 2019-03-13: 10:00:00 via INTRAVENOUS
  Administered 2019-03-13: 08:00:00 1000 mL via INTRAVENOUS

## 2019-03-13 MED ORDER — FENTANYL CITRATE (PF) 100 MCG/2ML IJ SOLN
INTRAMUSCULAR | Status: DC | PRN
  Administered 2019-03-13 (×5): 50 ug via INTRAVENOUS
  Administered 2019-03-13: 100 ug via INTRAVENOUS

## 2019-03-13 MED ORDER — CEFAZOLIN SODIUM-DEXTROSE 2-4 GM/100ML-% IV SOLN
INTRAVENOUS | Status: AC
  Filled 2019-03-13: qty 100

## 2019-03-13 MED ORDER — LORAZEPAM 2 MG/ML IJ SOLN: 1.0000 mg | INTRAMUSCULAR | Status: DC | PRN

## 2019-03-13 MED ORDER — ACETAMINOPHEN 325 MG PO TABS: 650.0000 mg | ORAL_TABLET | Freq: Four times a day (QID) | ORAL | Status: DC | PRN

## 2019-03-13 MED ORDER — SUGAMMADEX SODIUM 500 MG/5ML IV SOLN
INTRAVENOUS | Status: DC | PRN
  Administered 2019-03-13: 400 mg via INTRAVENOUS

## 2019-03-13 MED ORDER — OXYCODONE-ACETAMINOPHEN 5-325 MG PO TABS
1.0000 | ORAL_TABLET | ORAL | Status: DC | PRN
  Administered 2019-03-13 – 2019-03-15 (×3): 2 via ORAL
  Filled 2019-03-13 (×3): qty 2

## 2019-03-13 MED ORDER — BUPIVACAINE LIPOSOME 1.3 % IJ SUSP
INTRAMUSCULAR | Status: DC | PRN
  Administered 2019-03-13: 20 mL

## 2019-03-13 MED ORDER — ENOXAPARIN SODIUM 40 MG/0.4ML ~~LOC~~ SOLN
40.0000 mg | SUBCUTANEOUS | Status: DC
  Administered 2019-03-14 – 2019-03-15 (×2): 40 mg via SUBCUTANEOUS
  Filled 2019-03-13 (×2): qty 0.4

## 2019-03-13 MED ORDER — HYDROMORPHONE HCL 1 MG/ML IJ SOLN
0.2500 mg | INTRAMUSCULAR | Status: DC | PRN
  Administered 2019-03-13 (×4): 0.5 mg via INTRAVENOUS
  Filled 2019-03-13 (×2): qty 0.5

## 2019-03-13 MED ORDER — CHLORHEXIDINE GLUCONATE CLOTH 2 % EX PADS: 6.0000 | MEDICATED_PAD | Freq: Once | CUTANEOUS | Status: DC

## 2019-03-13 MED ORDER — ROCURONIUM BROMIDE 100 MG/10ML IV SOLN: INTRAVENOUS | Status: DC | PRN

## 2019-03-13 MED ORDER — LIDOCAINE HCL (CARDIAC) PF 100 MG/5ML IV SOSY
PREFILLED_SYRINGE | INTRAVENOUS | Status: DC | PRN
  Administered 2019-03-13: 100 mg via INTRAVENOUS

## 2019-03-13 MED ORDER — ACETAMINOPHEN 650 MG RE SUPP: 650.0000 mg | Freq: Four times a day (QID) | RECTAL | Status: DC | PRN

## 2019-03-13 MED ORDER — KETOROLAC TROMETHAMINE 30 MG/ML IJ SOLN
INTRAMUSCULAR | Status: DC | PRN
  Administered 2019-03-13: 30 mg via INTRAVENOUS

## 2019-03-13 MED ORDER — KETOROLAC TROMETHAMINE 30 MG/ML IJ SOLN
30.0000 mg | Freq: Four times a day (QID) | INTRAMUSCULAR | Status: DC | PRN
  Administered 2019-03-14 – 2019-03-15 (×3): 30 mg via INTRAVENOUS
  Filled 2019-03-13 (×4): qty 1

## 2019-03-13 MED ORDER — MIDAZOLAM HCL 2 MG/2ML IJ SOLN
INTRAMUSCULAR | Status: AC
  Filled 2019-03-13: qty 2

## 2019-03-13 MED ORDER — ONDANSETRON HCL 4 MG/2ML IJ SOLN
INTRAMUSCULAR | Status: DC | PRN
  Administered 2019-03-13: 4 mg via INTRAVENOUS

## 2019-03-13 MED ORDER — DIPHENHYDRAMINE HCL 25 MG PO CAPS: 25.0000 mg | ORAL_CAPSULE | Freq: Four times a day (QID) | ORAL | Status: DC | PRN

## 2019-03-13 MED ORDER — ENOXAPARIN SODIUM 40 MG/0.4ML ~~LOC~~ SOLN
40.0000 mg | Freq: Once | SUBCUTANEOUS | Status: AC
  Administered 2019-03-13: 08:00:00 40 mg via SUBCUTANEOUS

## 2019-03-13 MED ORDER — ROCURONIUM BROMIDE 10 MG/ML (PF) SYRINGE
PREFILLED_SYRINGE | INTRAVENOUS | Status: DC | PRN
  Administered 2019-03-13: 60 mg via INTRAVENOUS
  Administered 2019-03-13 (×2): 10 mg via INTRAVENOUS

## 2019-03-13 MED ORDER — MIDAZOLAM HCL 5 MG/5ML IJ SOLN
INTRAMUSCULAR | Status: DC | PRN
  Administered 2019-03-13: 1.5 mg via INTRAVENOUS
  Administered 2019-03-13: 0.5 mg via INTRAVENOUS

## 2019-03-13 MED ORDER — BUPIVACAINE LIPOSOME 1.3 % IJ SUSP
INTRAMUSCULAR | Status: AC
  Filled 2019-03-13: qty 20

## 2019-03-13 MED ORDER — ENOXAPARIN SODIUM 40 MG/0.4ML ~~LOC~~ SOLN
SUBCUTANEOUS | Status: AC
  Filled 2019-03-13: qty 0.4

## 2019-03-13 MED ORDER — PROPOFOL 10 MG/ML IV BOLUS
INTRAVENOUS | Status: DC | PRN
  Administered 2019-03-13: 250 mg via INTRAVENOUS

## 2019-03-13 MED ORDER — FENTANYL CITRATE (PF) 250 MCG/5ML IJ SOLN
INTRAMUSCULAR | Status: AC
  Filled 2019-03-13: qty 5

## 2019-03-13 MED ORDER — ONDANSETRON HCL 4 MG/2ML IJ SOLN: 4.0000 mg | Freq: Four times a day (QID) | INTRAMUSCULAR | Status: DC | PRN

## 2019-03-13 MED ORDER — PROPOFOL 10 MG/ML IV BOLUS
INTRAVENOUS | Status: AC
  Filled 2019-03-13: qty 20

## 2019-03-13 MED ORDER — FENTANYL CITRATE (PF) 100 MCG/2ML IJ SOLN
INTRAMUSCULAR | Status: AC
  Filled 2019-03-13: qty 2

## 2019-03-13 MED ORDER — ARTIFICIAL TEARS OPHTHALMIC OINT
TOPICAL_OINTMENT | OPHTHALMIC | Status: AC
  Filled 2019-03-13: qty 3.5

## 2019-03-13 MED ORDER — HYDROMORPHONE HCL 1 MG/ML IJ SOLN
INTRAMUSCULAR | Status: AC
  Filled 2019-03-13: qty 1

## 2019-03-13 MED ORDER — DIPHENHYDRAMINE HCL 50 MG/ML IJ SOLN: 25.0000 mg | Freq: Four times a day (QID) | INTRAMUSCULAR | Status: DC | PRN

## 2019-03-13 MED ORDER — DEXTROSE 5 % IV SOLN
3.0000 g | INTRAVENOUS | Status: AC
  Administered 2019-03-13: 3 g via INTRAVENOUS

## 2019-03-13 MED ORDER — GLYCOPYRROLATE PF 0.2 MG/ML IJ SOSY
PREFILLED_SYRINGE | INTRAMUSCULAR | Status: DC | PRN
  Administered 2019-03-13: .2 mg via INTRAVENOUS

## 2019-03-13 MED ORDER — ONDANSETRON 4 MG PO TBDP: 4.0000 mg | ORAL_TABLET | Freq: Four times a day (QID) | ORAL | Status: DC | PRN

## 2019-03-13 MED ORDER — SUGAMMADEX SODIUM 500 MG/5ML IV SOLN
INTRAVENOUS | Status: AC
  Filled 2019-03-13: qty 5

## 2019-03-13 MED ORDER — POVIDONE-IODINE 10 % OINT PACKET
TOPICAL_OINTMENT | CUTANEOUS | Status: DC | PRN
  Administered 2019-03-13: 1 via TOPICAL

## 2019-03-13 MED ORDER — HYDROMORPHONE HCL 1 MG/ML IJ SOLN
1.0000 mg | INTRAMUSCULAR | Status: DC | PRN
  Administered 2019-03-13 – 2019-03-14 (×4): 1 mg via INTRAVENOUS
  Filled 2019-03-13 (×4): qty 1

## 2019-03-13 SURGICAL SUPPLY — 46 items
APPLIER CLIP 11 MED OPEN (CLIP) ×3
APPLIER CLIP 13 LRG OPEN (CLIP) ×3
CHLORAPREP W/TINT 26 (MISCELLANEOUS) ×3 IMPLANT
CLIP APPLIE 11 MED OPEN (CLIP) ×1 IMPLANT
CLIP APPLIE 13 LRG OPEN (CLIP) ×1 IMPLANT
CLOTH BEACON ORANGE TIMEOUT ST (SAFETY) ×3 IMPLANT
COVER LIGHT HANDLE STERIS (MISCELLANEOUS) ×6 IMPLANT
COVER WAND RF STERILE (DRAPES) ×6 IMPLANT
CUTTER FLEX LINEAR 45M (STAPLE) ×3 IMPLANT
DRAPE WARM FLUID 44X44 (DRAPES) ×3 IMPLANT
ELECT BLADE 6 FLAT ULTRCLN (ELECTRODE) ×3 IMPLANT
ELECT REM PT RETURN 9FT ADLT (ELECTROSURGICAL) ×3
ELECTRODE REM PT RTRN 9FT ADLT (ELECTROSURGICAL) ×1 IMPLANT
GAUZE SPONGE 4X4 12PLY STRL (GAUZE/BANDAGES/DRESSINGS) ×3 IMPLANT
GLOVE BIO SURGEON STRL SZ 6.5 (GLOVE) ×2 IMPLANT
GLOVE BIO SURGEONS STRL SZ 6.5 (GLOVE) ×1
GLOVE BIOGEL PI IND STRL 6.5 (GLOVE) ×1 IMPLANT
GLOVE BIOGEL PI IND STRL 7.0 (GLOVE) ×2 IMPLANT
GLOVE BIOGEL PI INDICATOR 6.5 (GLOVE) ×2
GLOVE BIOGEL PI INDICATOR 7.0 (GLOVE) ×4
GLOVE SURG SS PI 7.5 STRL IVOR (GLOVE) ×3 IMPLANT
GOWN STRL REUS W/TWL LRG LVL3 (GOWN DISPOSABLE) ×9 IMPLANT
HEMOSTAT ARISTA ABSORB 3G PWDR (HEMOSTASIS) ×3 IMPLANT
HEMOSTAT SNOW SURGICEL 2X4 (HEMOSTASIS) ×3 IMPLANT
HEMOSTAT SURGICEL 4X8 (HEMOSTASIS) ×3 IMPLANT
INST SET MAJOR GENERAL (KITS) ×3 IMPLANT
KIT TURNOVER KIT A (KITS) ×3 IMPLANT
MANIFOLD NEPTUNE II (INSTRUMENTS) ×3 IMPLANT
NEEDLE HYPO 18GX1.5 BLUNT FILL (NEEDLE) ×3 IMPLANT
NEEDLE HYPO 22GX1.5 SAFETY (NEEDLE) ×3 IMPLANT
NS IRRIG 1000ML POUR BTL (IV SOLUTION) ×6 IMPLANT
PACK ABDOMINAL MAJOR (CUSTOM PROCEDURE TRAY) ×3 IMPLANT
PAD ARMBOARD 7.5X6 YLW CONV (MISCELLANEOUS) ×3 IMPLANT
RELOAD STAPLE TA45 3.5 REG BLU (ENDOMECHANICALS) ×3 IMPLANT
RELOAD STAPLER LINEAR PROX 30 (STAPLE) ×1 IMPLANT
SET BASIN LINEN APH (SET/KITS/TRAYS/PACK) ×3 IMPLANT
SPONGE INTESTINAL PEANUT (DISPOSABLE) ×3 IMPLANT
SPONGE SURGIFOAM ABS GEL 100 (HEMOSTASIS) IMPLANT
STAPLER RELOAD LINEAR PROX 30 (STAPLE) ×3
STAPLER VISISTAT (STAPLE) ×3 IMPLANT
SUT VIC AB 0 CT1 27 (SUTURE) ×6
SUT VIC AB 0 CT1 27XBRD ANTBC (SUTURE) ×2 IMPLANT
SUT VIC AB 0 CT1 27XCR 8 STRN (SUTURE) ×1 IMPLANT
SYR 20CC LL (SYRINGE) ×6 IMPLANT
TAPE CLOTH SURG 4X10 WHT LF (GAUZE/BANDAGES/DRESSINGS) ×3 IMPLANT
YANKAUER SUCT 12FT TUBE ARGYLE (SUCTIONS) ×3 IMPLANT

## 2019-03-13 NOTE — Anesthesia Procedure Notes (Signed)
Procedure Name: Intubation Date/Time: 03/13/2019 8:39 AM Performed by: Louann Sjogren, MD Pre-anesthesia Checklist: Patient identified, Patient being monitored, Timeout performed, Emergency Drugs available and Suction available Patient Re-evaluated:Patient Re-evaluated prior to induction Oxygen Delivery Method: Circle System Utilized Preoxygenation: Pre-oxygenation with 100% oxygen Induction Type: IV induction Ventilation: Mask ventilation without difficulty Laryngoscope Size: Mac, Sabra Heck and 2 Grade View: Grade II Tube type: Oral Tube size: 7.0 mm Number of attempts: 1 Airway Equipment and Method: stylet Placement Confirmation: ETT inserted through vocal cords under direct vision,  positive ETCO2 and breath sounds checked- equal and bilateral Secured at: 23 cm Tube secured with: Tape Dental Injury: Teeth and Oropharynx as per pre-operative assessment

## 2019-03-13 NOTE — Transfer of Care (Signed)
Immediate Anesthesia Transfer of Care Note  Patient: Gordon [name]  Procedure(s) Performed: OPEN CHOLECYSTECTOMY (N/A )  Patient Location: PACU  Anesthesia Type:General  Level of Consciousness: awake, alert  and oriented  Airway & Oxygen Therapy: Patient Spontanous Breathing and Patient connected to face mask oxygen  Post-op Assessment: Report given to RN, Post -op Vital signs reviewed and stable and Patient moving all extremities X 4  Post vital signs: Reviewed and stable  Last Vitals:  Vitals Value Taken Time  BP    Temp    Pulse 76 03/13/19 1038  Resp 15 03/13/19 1038  SpO2 100 % 03/13/19 1038  Vitals shown include unvalidated device data.  Last Pain:  Vitals:   03/13/19 0748  TempSrc: Oral  PainSc: 0-No pain      Patients Stated Pain Goal: 3 (11/57/26 2035)  Complications: No apparent anesthesia complications

## 2019-03-13 NOTE — Addendum Note (Signed)
Addendum  created 03/13/19 1310 by Mickel Baas, CRNA   Charge Capture section accepted

## 2019-03-13 NOTE — Op Note (Signed)
Patient:  Gordon [name]  DOB:  03/02/1981  MRN:  [id number]   Preop Diagnosis: Biliary colic, cholelithiasis  Postop Diagnosis: Same  Procedure: Open cholecystectomy  Surgeon: Aviva Signs, MD  Assistant: Curlene Labrum, MD  Anes: General endotracheal  Indications: Patient is a 38 year old black male status post multiple abdominal surgeries in the past as well as morbid obesity who presents with recurrent episodes of cholecystitis secondary to cholelithiasis.  The risks and benefits of the procedure including bleeding, infection, cardiopulmonary difficulties, and hepatobiliary injury were fully explained to the patient, who gave informed consent.  Procedure note: The patient was placed in the supine position.  After induction of general endotracheal anesthesia, the abdomen was prepped and draped using the usual sterile technique with ChloraPrep.  Surgical site confirmation was performed.  A right subcostal incision was made down to the abdominal wall.  The rectus and oblique muscles were divided using Bovie electrocautery.  The abdominal cavity was then entered into without difficulty.  Given the patient's size, the gallbladder extended deep into the right upper quadrant.  The liver in the field of surgery was noted to be within normal limits.  A dome down approach was used down to the infundibulum.  The cystic duct was fully identified.  We were able to identify all the structures with the infundibulum on a pedicle.  Multiple stones were milked up into the lumen of the gallbladder.  A TA-30 stapler was placed across the juncture of the infundibulum to the cystic duct.  The infundibulum was then divided using a sharp knife.  The gallbladder was then removed from the operative field.  The right upper quadrant was copiously irrigated with normal saline.  No active bleeding was noted in the liver bed.  Arista and Surgicel were placed in the gallbladder fossa.  The posterior rectus sheath  was reapproximated using a running 0 Vicryl suture.  The anterior rectus sheath was reapproximated using interrupted 0 Vicryl sutures.  The subcutaneous layer was irrigated with normal saline.  The incision was instilled with Exparel.  The skin was closed using staples.  Betadine ointment and dry sterile dressing were applied.  All tape and needle counts were correct at the end of the procedure.  The patient was extubated in the operating room and transferred to PACU in stable condition.  Complications: None  EBL: 500 cc  Specimen: Gallbladder

## 2019-03-13 NOTE — Interval H&P Note (Signed)
History and Physical Interval Note:  03/13/2019 8:04 AM  Gordon [name]  has presented today for surgery, with the diagnosis of cholelithiasis, cholecystitis.  The various methods of treatment have been discussed with the patient and family. After consideration of risks, benefits and other options for treatment, the patient has consented to  Procedure(s): OPEN CHOLECYSTECTOMY (N/A) as a surgical intervention.  The patient's history has been reviewed, patient examined, no change in status, stable for surgery.  I have reviewed the patient's chart and labs.  Questions were answered to the patient's satisfaction.     Aviva Signs

## 2019-03-13 NOTE — Anesthesia Preprocedure Evaluation (Addendum)
Anesthesia Evaluation  Patient identified by MRN, date of birth, ID band Patient awake    Reviewed: Allergy & Precautions, NPO status , Patient's Chart, lab work & pertinent test results, reviewed documented beta blocker date and time   Airway        Dental   Pulmonary former smoker,           Cardiovascular      Neuro/Psych    GI/Hepatic negative GI ROS, Neg liver ROS,   Endo/Other  Morbid obesity  Renal/GU negative Renal ROS     Musculoskeletal   Abdominal   Peds  Hematology negative hematology ROS (+)   Anesthesia Other Findings   Reproductive/Obstetrics                             Anesthesia Physical Anesthesia Plan  ASA: III  Anesthesia Plan: General   Post-op Pain Management:    Induction:   PONV Risk Score and Plan: 1 and Ondansetron  Airway Management Planned:   Additional Equipment:   Intra-op Plan:   Post-operative Plan:   Informed Consent:   Plan Discussed with: Anesthesiologist  Anesthesia Plan Comments:        Anesthesia Quick Evaluation

## 2019-03-13 NOTE — Anesthesia Postprocedure Evaluation (Signed)
Anesthesia Post Note Late Entry for 1100  Patient: DALLAS TOROK  Procedure(s) Performed: OPEN CHOLECYSTECTOMY (N/A )  Patient location during evaluation: PACU Anesthesia Type: General Level of consciousness: awake and alert and oriented Pain management: pain level controlled Vital Signs Assessment: post-procedure vital signs reviewed and stable Respiratory status: spontaneous breathing Cardiovascular status: stable Postop Assessment: no apparent nausea or vomiting Anesthetic complications: no     Last Vitals:  Vitals:   03/13/19 1115 03/13/19 1150  BP: (!) 136/91 140/77  Pulse: 85 90  Resp: 17 16  Temp: 36.4 C 36.7 C  SpO2: 100% 94%    Last Pain:  Vitals:   03/13/19 1152  TempSrc:   PainSc: 8                  ADAMS, AMY A

## 2019-03-14 LAB — COMPREHENSIVE METABOLIC PANEL
ALT: 105 U/L — ABNORMAL HIGH (ref 0–44)
AST: 65 U/L — ABNORMAL HIGH (ref 15–41)
Albumin: 3.5 g/dL (ref 3.5–5.0)
Alkaline Phosphatase: 47 U/L (ref 38–126)
Anion gap: 11 (ref 5–15)
BUN: 13 mg/dL (ref 6–20)
CO2: 23 mmol/L (ref 22–32)
Calcium: 8.7 mg/dL — ABNORMAL LOW (ref 8.9–10.3)
Chloride: 100 mmol/L (ref 98–111)
Creatinine, Ser: 0.9 mg/dL (ref 0.61–1.24)
GFR calc Af Amer: >60 mL/min (ref >60)
GFR calc non Af Amer: >60 mL/min (ref >60)
Glucose, Bld: 153 mg/dL — ABNORMAL HIGH (ref 70–99)
Potassium: 3.9 mmol/L (ref 3.5–5.1)
Sodium: 134 mmol/L — ABNORMAL LOW (ref 135–145)
Total Bilirubin: 0.5 mg/dL (ref 0.3–1.2)
Total Protein: 6.7 g/dL (ref 6.5–8.1)

## 2019-03-14 LAB — CBC
HCT: 37.8 % — ABNORMAL LOW (ref 39.0–52.0)
Hemoglobin: 12.1 g/dL — ABNORMAL LOW (ref 13.0–17.0)
MCH: 27.1 pg (ref 26.0–34.0)
MCHC: 32 g/dL (ref 30.0–36.0)
MCV: 84.6 fL (ref 80.0–100.0)
Platelets: 257 10*3/uL (ref 150–400)
RBC: 4.47 MIL/uL (ref 4.22–5.81)
RDW: 13.3 % (ref 11.5–15.5)
WBC: 13.5 10*3/uL — ABNORMAL HIGH (ref 4.0–10.5)
nRBC: 0 % (ref 0.0–0.2)

## 2019-03-14 LAB — MAGNESIUM: Magnesium: 1.6 mg/dL — ABNORMAL LOW (ref 1.7–2.4)

## 2019-03-14 LAB — PHOSPHORUS: Phosphorus: 3.3 mg/dL (ref 2.5–4.6)

## 2019-03-14 MED ORDER — MAGNESIUM SULFATE 2 GM/50ML IV SOLN
2.0000 g | Freq: Once | INTRAVENOUS | Status: AC
  Administered 2019-03-14: 2 g via INTRAVENOUS
  Filled 2019-03-14: qty 50

## 2019-03-14 NOTE — Progress Notes (Signed)
1 Day Post-Op  Subjective: Moderate incisional pain but well controlled with IV pain medication.  Is passing flatus.  Objective: Vital signs in last 24 hours: Temp:  [97.6 F (36.4 C)-98.9 F (37.2 C)] 98.9 F (37.2 C) (07/14 0412) Pulse Rate:  [61-106] 106 (07/14 0412) Resp:  [11-20] 18 (07/14 0412) BP: (112-160)/(65-102) 123/65 (07/14 0412) SpO2:  [90 %-100 %] 94 % (07/14 0412) Weight:  [142.9 kg] 142.9 kg (07/13 1207) Last BM Date: 03/13/19  Intake/Output from previous day: 07/13 0701 - 07/14 0700 In: 3454.9 [P.O.:720; I.V.:2734.9] Out: 1500 [Urine:1000; Blood:500] Intake/Output this shift: No intake/output data recorded.  General appearance: alert, cooperative and no distress Resp: clear to auscultation bilaterally Cardio: regular rate and rhythm, S1, S2 normal, no murmur, click, rub or gallop GI: Soft, bowel sounds present.  Incision healing well.  Lab Results:  Recent Labs    03/14/19 0439  WBC 13.5*  HGB 12.1*  HCT 37.8*  PLT 257   BMET Recent Labs    03/14/19 0439  NA 134*  K 3.9  CL 100  CO2 23  GLUCOSE 153*  BUN 13  CREATININE 0.90  CALCIUM 8.7*   PT/INR No results for input(s): LABPROT, INR in the last 72 hours.  Studies/Results: No results found.  Anti-infectives: Anti-infectives (From admission, onward)   Start     Dose/Rate Route Frequency Ordered Stop   03/13/19 0745  ceFAZolin (ANCEF) 3 g in dextrose 5 % 50 mL IVPB     3 g 100 mL/hr over 30 Minutes Intravenous On call to O.R. 03/13/19 6962 03/13/19 0855      Assessment/Plan: s/p Procedure(s): OPEN CHOLECYSTECTOMY Impression: Stable on postoperative day 1.  Tolerating full liquid diet well.  Has voided.  Hypomagnesemia will be addressed.  Advance diet.  LOS: 1 day    Aviva Signs 03/14/2019

## 2019-03-14 NOTE — Progress Notes (Signed)
Has eaten about 50% of breakfast and lunch tray and denies any increased pain or nausea.  Has walked in hall and reports passing some gas.  Dressing dry and intact, abd soft.

## 2019-03-15 ENCOUNTER — Encounter (HOSPITAL_COMMUNITY): Payer: Self-pay | Admitting: General Surgery

## 2019-03-15 LAB — COMPREHENSIVE METABOLIC PANEL
ALT: 69 U/L — ABNORMAL HIGH (ref 0–44)
AST: 37 U/L (ref 15–41)
Albumin: 3.2 g/dL — ABNORMAL LOW (ref 3.5–5.0)
Alkaline Phosphatase: 45 U/L (ref 38–126)
Anion gap: 8 (ref 5–15)
BUN: 15 mg/dL (ref 6–20)
CO2: 30 mmol/L (ref 22–32)
Calcium: 8.6 mg/dL — ABNORMAL LOW (ref 8.9–10.3)
Chloride: 100 mmol/L (ref 98–111)
Creatinine, Ser: 0.92 mg/dL (ref 0.61–1.24)
GFR calc Af Amer: >60 mL/min (ref >60)
GFR calc non Af Amer: >60 mL/min (ref >60)
Glucose, Bld: 140 mg/dL — ABNORMAL HIGH (ref 70–99)
Potassium: 3.8 mmol/L (ref 3.5–5.1)
Sodium: 138 mmol/L (ref 135–145)
Total Bilirubin: 0.2 mg/dL — ABNORMAL LOW (ref 0.3–1.2)
Total Protein: 6.3 g/dL — ABNORMAL LOW (ref 6.5–8.1)

## 2019-03-15 LAB — CBC
HCT: 34.8 % — ABNORMAL LOW (ref 39.0–52.0)
Hemoglobin: 10.8 g/dL — ABNORMAL LOW (ref 13.0–17.0)
MCH: 26.7 pg (ref 26.0–34.0)
MCHC: 31 g/dL (ref 30.0–36.0)
MCV: 85.9 fL (ref 80.0–100.0)
Platelets: 183 10*3/uL (ref 150–400)
RBC: 4.05 MIL/uL — ABNORMAL LOW (ref 4.22–5.81)
RDW: 13.3 % (ref 11.5–15.5)
WBC: 8.5 10*3/uL (ref 4.0–10.5)
nRBC: 0 % (ref 0.0–0.2)

## 2019-03-15 LAB — MAGNESIUM: Magnesium: 2 mg/dL (ref 1.7–2.4)

## 2019-03-15 LAB — PHOSPHORUS: Phosphorus: 3 mg/dL (ref 2.5–4.6)

## 2019-03-15 MED ORDER — OXYCODONE-ACETAMINOPHEN 7.5-325 MG PO TABS: 1.0000 | ORAL_TABLET | Freq: Four times a day (QID) | ORAL | 0 refills | Status: AC | PRN

## 2019-03-15 NOTE — Discharge Instructions (Signed)
Open Cholecystectomy, Care After This sheet gives you information about how to care for yourself after your procedure. Your health care provider may also give you more specific instructions. If you have problems or questions, contact your health care provider. What can I expect after the procedure? After the procedure, it is common to have:  Pain at your incision site. You will be given medicines to control this pain.  Mild nausea or vomiting. Follow these instructions at home: Incision care   Follow instructions from your health care provider about how to take care of your incision. Make sure you: ? Wash your hands with soap and water before you change your bandage (dressing). If soap and water are not available, use hand sanitizer. ? Change your dressing as told by your health care provider. ? Leave stitches (sutures), skin glue, or adhesive strips in place. These skin closures may need to be in place for 2 weeks or longer. If adhesive strip edges start to loosen and curl up, you may trim the loose edges. Do not remove adhesive strips completely unless your health care provider tells you to do that.  Do not take baths, swim, or use a hot tub until your health care provider approves. Ask your health care provider if you can take showers. You may only be allowed to take sponge baths for bathing.  Check your incision area every day for signs of infection. Check for: ? More redness, swelling, or pain. ? More fluid or blood. ? Warmth. ? Pus or a bad smell. Activity  Do not drive or use heavy machinery while taking prescription pain medicine.  Do not lift anything that is heavier than 10 lb (4.5 kg) until your health care provider approves.  Do not play contact sports until your health care provider approves.  Do not drive for 24 hours if you were given a medicine to help you relax (sedative).  Rest as needed. Do not return to work or school until your health care provider  approves. General instructions  Take over-the-counter and prescription medicines only as told by your health care provider.  To prevent or treat constipation while you are taking prescription pain medicine, your health care provider may recommend that you: ? Drink enough fluid to keep your urine clear or pale yellow. ? Take over-the-counter or prescription medicines. ? Eat foods that are high in fiber, such as fresh fruits and vegetables, whole grains, and beans. ? Limit foods that are high in fat and processed sugars, such as fried and sweet foods. Contact a health care provider if:  You develop a rash.  You have more redness, swelling, or pain around your incision.  You have more fluid or blood coming from your incision.  Your incision feels warm to the touch.  You have pus or a bad smell coming from your incision.  You have a fever.  Your incision breaks open. Get help right away if:  You have trouble breathing.  You have chest pain.  You have increasing pain in your shoulders.  You faint or feel dizzy when you stand.  You have severe pain in your abdomen.  You have nausea or vomiting that lasts for more than one day.  You have leg pain. This information is not intended to replace advice given to you by your health care provider. Make sure you discuss any questions you have with your health care provider. Document Released: 12/03/2003 Document Revised: 07/30/2017 Document Reviewed: 02/03/2016 Elsevier Patient Education  2020 Elsevier  Inc. ° °

## 2019-03-15 NOTE — Addendum Note (Signed)
Addendum  created 03/15/19 0753 by Ollen Bowl, CRNA   Clinical Note Signed, SmartForm saved

## 2019-03-15 NOTE — Discharge Summary (Signed)
Physician Discharge Summary  Patient ID: Gordon [name] MRN: [id number] DOB/AGE: March 26, 1981 38 y.o.  Admit date: 03/13/2019 Discharge date: 03/15/2019  Admission Diagnoses: Biliary colic, cholelithiasis  Discharge Diagnoses: Same Active Problems:   Calculus of gallbladder without cholecystitis without obstruction   S/P cholecystectomy Morbid obesity  Discharged Condition: good  Hospital Course: Patient is a 38 year old black male with biliary colic secondary to cholelithiasis who underwent an open cholecystectomy on 03/13/2019.  He tolerated the surgery well.  His postoperative course was for the most part unremarkable.  He had mild hypomagnesemia which was corrected.  His diet was advanced out difficulty.  The patient is being discharged home on 03/15/2019 in good and improving condition.  Treatments: surgery: Open cholecystectomy on 03/13/2019  Discharge Exam: Blood pressure 116/73, pulse 68, temperature 98.2 F (36.8 C), temperature source Oral, resp. rate 16, height 5\' 11"  (1.803 m), weight (!) 142.9 kg, SpO2 96 %. General appearance: alert, cooperative and no distress Resp: clear to auscultation bilaterally Cardio: regular rate and rhythm, S1, S2 normal, no murmur, click, rub or gallop GI: Soft, incision healing well.  Disposition: Discharge disposition: 01-Home or Self Care       Discharge Instructions    Diet - low sodium heart healthy   Complete by: As directed    Increase activity slowly   Complete by: As directed      Allergies as of 03/15/2019   No Known Allergies     Medication List    TAKE these medications   oxyCODONE-acetaminophen 7.5-325 MG tablet Commonly known as: Percocet Take 1 tablet by mouth every 6 (six) hours as needed.      Follow-up Information    Aviva Signs, MD. Schedule an appointment as soon as possible for a visit on 03/23/2019.   Specialty: General Surgery Contact information: [address]  [address] [phone]           Signed: Aviva Signs 03/15/2019, 8:34 AM

## 2019-03-15 NOTE — Progress Notes (Signed)
IV removed, discharge instructions reviewed.  Wife to drive home

## 2019-03-20 ENCOUNTER — Telehealth: Payer: Self-pay

## 2019-03-20 NOTE — Telephone Encounter (Signed)
Patient left voice mail 03/17/2019 stating that he had a sharp pain prior to phone call and wanted to know if that was normal or should he be concerned. Otherwise patient states he is doing fine.

## 2019-03-22 ENCOUNTER — Encounter (HOSPITAL_COMMUNITY): Payer: Self-pay | Admitting: General Surgery

## 2019-03-22 NOTE — Addendum Note (Signed)
Addendum  created 03/22/19 1144 by Vista Deck, CRNA   Intraprocedure Event edited, Intraprocedure Staff edited

## 2019-03-23 ENCOUNTER — Other Ambulatory Visit: Payer: Self-pay

## 2019-03-23 ENCOUNTER — Encounter: Payer: Self-pay | Admitting: General Surgery

## 2019-03-23 ENCOUNTER — Ambulatory Visit (INDEPENDENT_AMBULATORY_CARE_PROVIDER_SITE_OTHER): Payer: Self-pay | Admitting: General Surgery

## 2019-03-23 DIAGNOSIS — Z09 Encounter for follow-up examination after completed treatment for conditions other than malignant neoplasm: Secondary | ICD-10-CM

## 2019-03-23 NOTE — Progress Notes (Signed)
Subjective:     Gordon [name]  Patient here for postoperative visit.  Doing well.  Preoperative symptoms have resolved.  He denies any fever or chills. Objective:    There were no vitals taken for this visit.  General:  alert, cooperative and no distress  Abdomen soft, incision healing well.  Staples removed, Steri-Strips applied. Final pathology consistent with diagnosis.     Assessment:    Doing well postoperatively.    Plan:   Increase activity as able.  Follow-up here as needed.

## 2019-06-22 ENCOUNTER — Other Ambulatory Visit: Payer: Self-pay

## 2019-06-22 DIAGNOSIS — Z20822 Contact with and (suspected) exposure to covid-19: Secondary | ICD-10-CM

## 2019-06-24 LAB — NOVEL CORONAVIRUS, NAA: SARS-CoV-2, NAA: NOT DETECTED

## 2020-01-29 ENCOUNTER — Ambulatory Visit
Admission: EM | Admit: 2020-01-29 | Discharge: 2020-01-29 | Disposition: A | Discharge status: Home / Self Care | Payer: 59 | Attending: Emergency Medicine | Admitting: Emergency Medicine

## 2020-01-29 DIAGNOSIS — J302 Other seasonal allergic rhinitis: Secondary | ICD-10-CM | POA: Diagnosis not present

## 2020-01-29 DIAGNOSIS — Z1152 Encounter for screening for COVID-19: Secondary | ICD-10-CM

## 2020-01-29 DIAGNOSIS — Z20822 Contact with and (suspected) exposure to covid-19: Secondary | ICD-10-CM

## 2020-01-29 MED ORDER — CETIRIZINE HCL 10 MG PO TABS: 10.0000 mg | ORAL_TABLET | Freq: Every day | ORAL | 0 refills | Status: DC

## 2020-01-29 MED ORDER — BENZONATATE 100 MG PO CAPS: 100.0000 mg | ORAL_CAPSULE | Freq: Three times a day (TID) | ORAL | 0 refills | Status: AC

## 2020-01-29 MED ORDER — FLUTICASONE PROPIONATE 50 MCG/ACT NA SUSP: 1.0000 | Freq: Every day | NASAL | 0 refills | Status: DC

## 2020-01-29 NOTE — ED Triage Notes (Signed)
Pt presents with c/o nasal congestion and cough that began on Friday
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
## 2020-01-29 NOTE — Discharge Instructions (Addendum)
COVID testing ordered.  It will take between 2-7 days for test results.  Someone will contact you regarding abnormal results.    In the meantime: You should remain isolated in your home for 10 days from symptom onset AND greater than 24 hours after symptoms resolution (absence of fever without the use of fever-reducing medication and improvement in respiratory symptoms), whichever is longer Get plenty of rest and push fluids Tessalon Perles prescribed for cough Zyrtec-D prescribed for nasal congestion, runny nose, and/or sore throat Flonase was prescribed Use medications daily for symptom relief Use OTC medications like ibuprofen or tylenol as needed fever or pain Call or go to the ED if you have any new or worsening symptoms such as fever, worsening cough, shortness of breath, chest tightness, chest pain, turning blue, changes in mental status, etc..Marland Kitchen

## 2020-01-31 LAB — SARS-COV-2, NAA 2 DAY TAT

## 2020-01-31 LAB — NOVEL CORONAVIRUS, NAA: SARS-CoV-2, NAA: NOT DETECTED

## 2020-03-14 ENCOUNTER — Other Ambulatory Visit: Payer: Self-pay

## 2020-03-14 ENCOUNTER — Telehealth: Payer: 59

## 2020-03-14 ENCOUNTER — Ambulatory Visit (INDEPENDENT_AMBULATORY_CARE_PROVIDER_SITE_OTHER)
Admission: RE | Admit: 2020-03-14 | Discharge: 2020-03-14 | Disposition: A | Discharge status: Home / Self Care | Payer: 59 | Source: Ambulatory Visit

## 2020-03-14 DIAGNOSIS — R140 Abdominal distension (gaseous): Secondary | ICD-10-CM

## 2020-03-14 DIAGNOSIS — R110 Nausea: Secondary | ICD-10-CM

## 2020-03-14 MED ORDER — ONDANSETRON HCL 4 MG PO TABS: 4.0000 mg | ORAL_TABLET | Freq: Four times a day (QID) | ORAL | 0 refills | Status: AC

## 2020-03-14 NOTE — Discharge Instructions (Addendum)
I have sent in Zofran to your pharmacy for you to take for nausea 1 tablet every 6 hours as needed  If you are not feeling better, I would have you come into the office to be seen in person over the weekend, if not you may see GI or your primary care  If things are getting worse, you are experiencing unrelenting pain, trouble swallowing, trouble breathing, feel like you are becoming dehydrated, I would have you follow-up with the ER
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
## 2020-03-20 DIAGNOSIS — F419 Anxiety disorder, unspecified: Secondary | ICD-10-CM | POA: Diagnosis not present

## 2020-03-25 DIAGNOSIS — I10 Essential (primary) hypertension: Secondary | ICD-10-CM | POA: Diagnosis not present

## 2020-03-25 DIAGNOSIS — R0683 Snoring: Secondary | ICD-10-CM | POA: Diagnosis not present

## 2020-03-25 DIAGNOSIS — Z6841 Body Mass Index (BMI) 40.0 and over, adult: Secondary | ICD-10-CM | POA: Diagnosis not present

## 2020-03-25 DIAGNOSIS — Z299 Encounter for prophylactic measures, unspecified: Secondary | ICD-10-CM | POA: Diagnosis not present

## 2020-04-03 DIAGNOSIS — F329 Major depressive disorder, single episode, unspecified: Secondary | ICD-10-CM | POA: Diagnosis not present

## 2020-04-11 DIAGNOSIS — G4730 Sleep apnea, unspecified: Secondary | ICD-10-CM | POA: Diagnosis not present

## 2020-05-02 DIAGNOSIS — R0683 Snoring: Secondary | ICD-10-CM | POA: Diagnosis not present

## 2020-07-05 IMAGING — CT CT ABDOMEN AND PELVIS WITH CONTRAST
2 of 4 series · 16 of 46 positions shown, 18 images · IV contrast (Isovue)
Comparison: CT scan 12/21/2015

CLINICAL DATA: Abdominal pain since 1 a.m. this morning.

EXAM:
CT ABDOMEN AND PELVIS WITH CONTRAST
TECHNIQUE: Multidetector CT imaging of the abdomen and pelvis was performed
using the standard protocol following bolus administration of
intravenous contrast.
CONTRAST:  100mL OMNIPAQUE IOHEXOL 300 MG/ML  SOLN

[Series 2: axial st · axial · 0.80mm/px · z∈[+1018,+1443]mm · 13 of 95 slices shown, 15 images]
[im 5/95  soft-tissue]
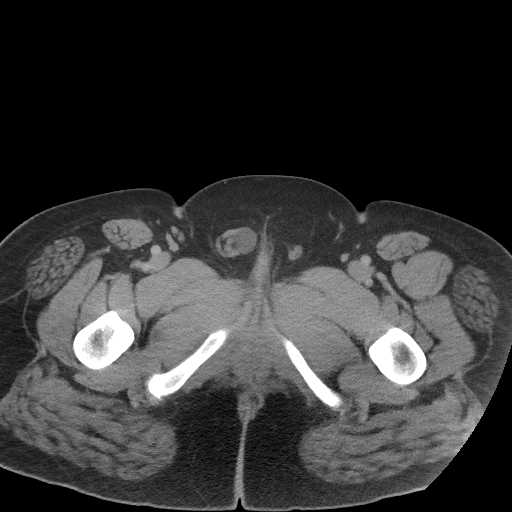
[im 5/95  bone]
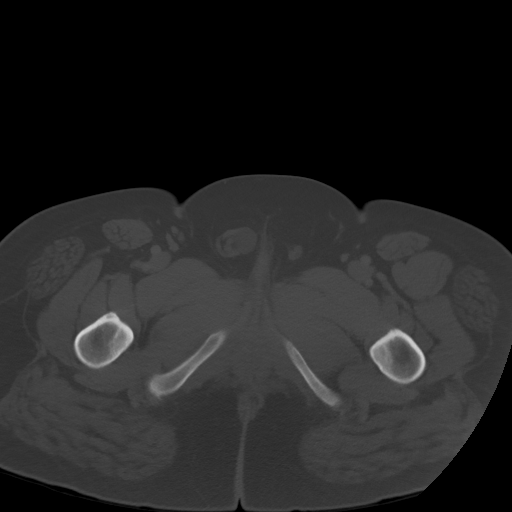
[im 15/95  soft-tissue]
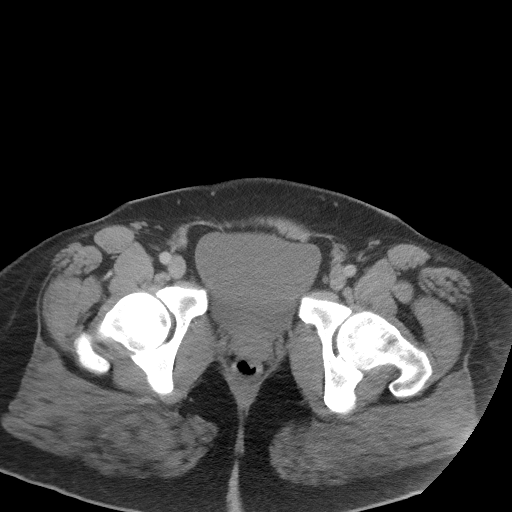
[im 19/95  soft-tissue]
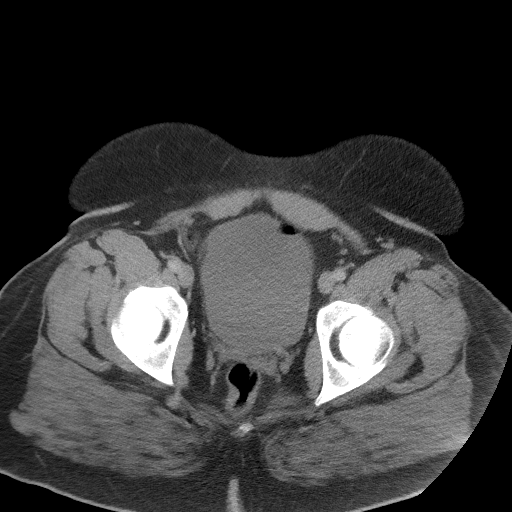
[im 29/95  soft-tissue]
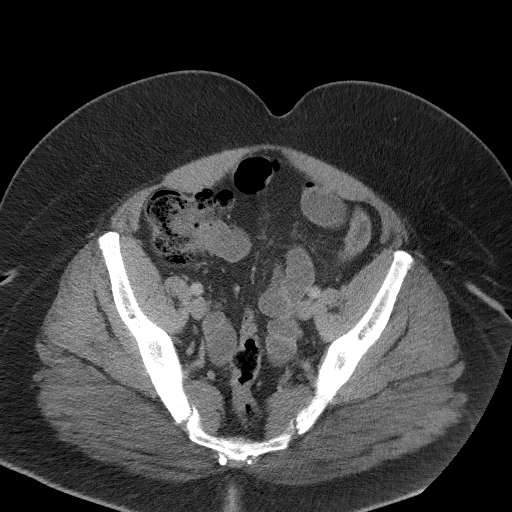
[im 33/95  soft-tissue]
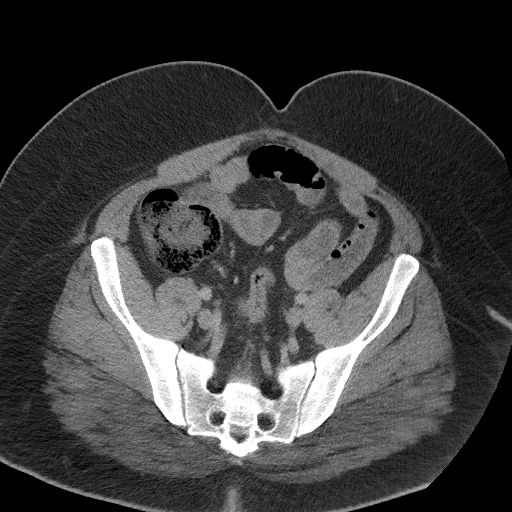
[im 43/95  soft-tissue]
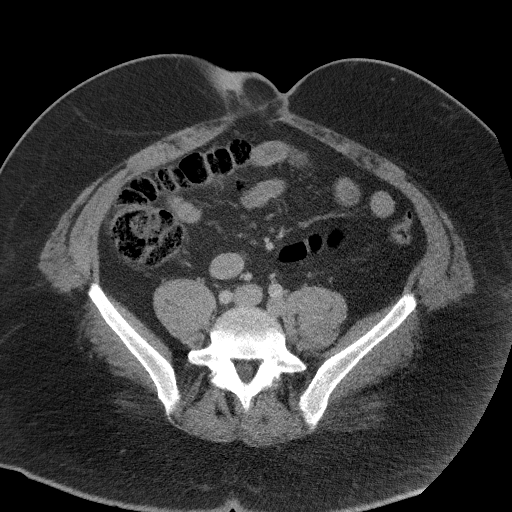
[im 48/95  soft-tissue]
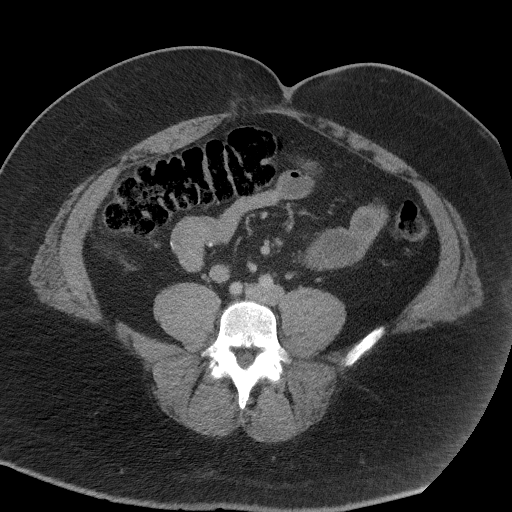
[im 52/95  soft-tissue]
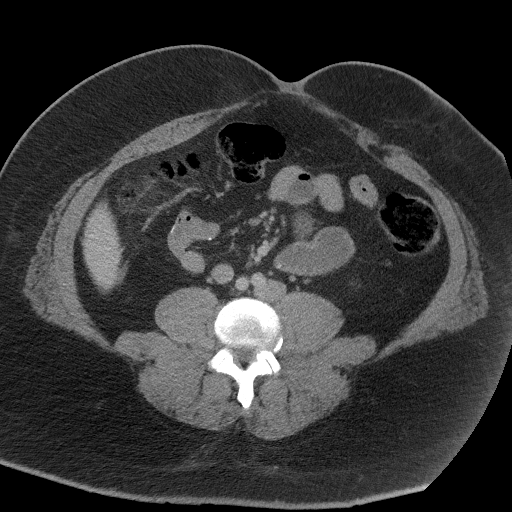
[im 62/95  soft-tissue]
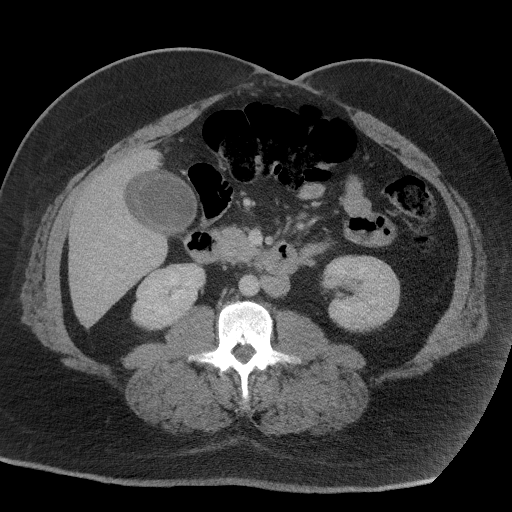
[im 62/95  bone]
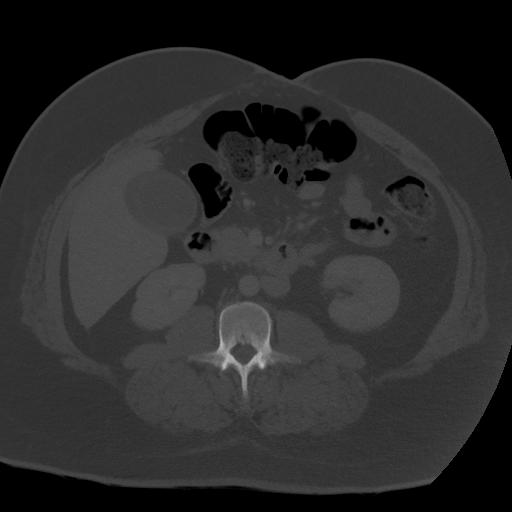
[im 66/95  soft-tissue]
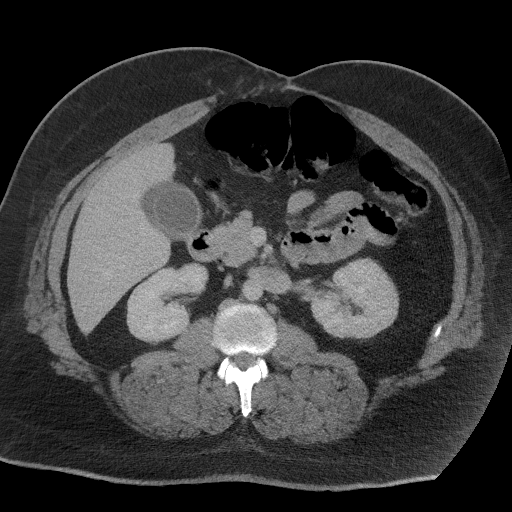
[im 76/95  soft-tissue]
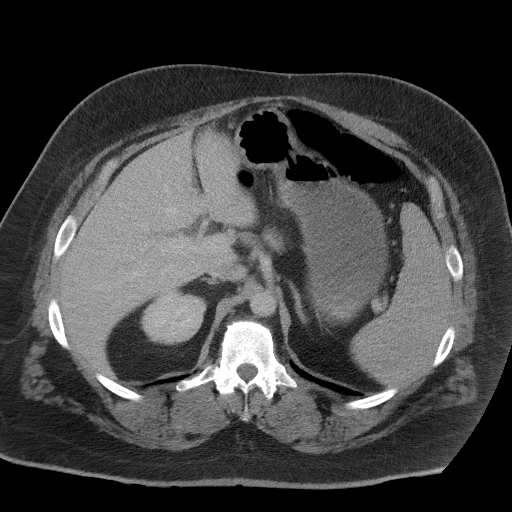
[im 80/95  soft-tissue]
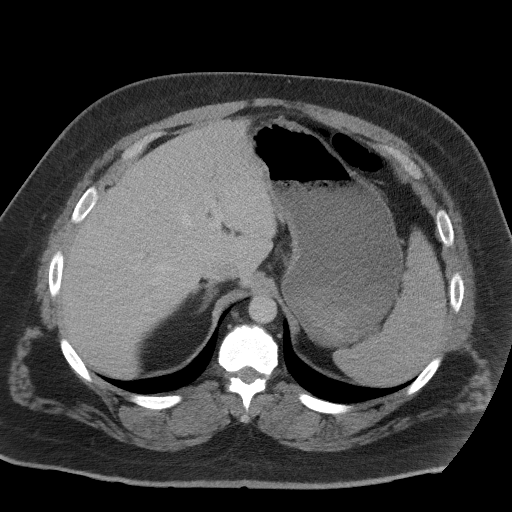
[im 90/95  soft-tissue]
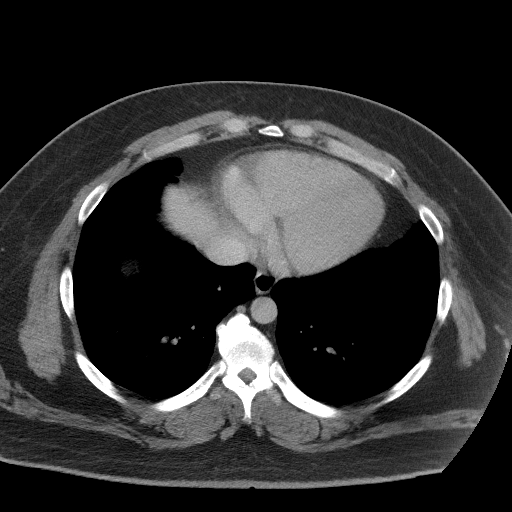

[Series 5: coronal st · coronal · 0.92mm/px · 3 of 126 slices shown]
[im 42/126  soft-tissue]
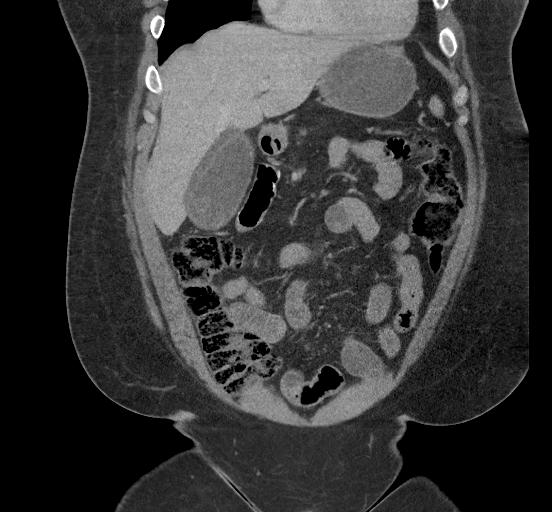
[im 56/126  soft-tissue]
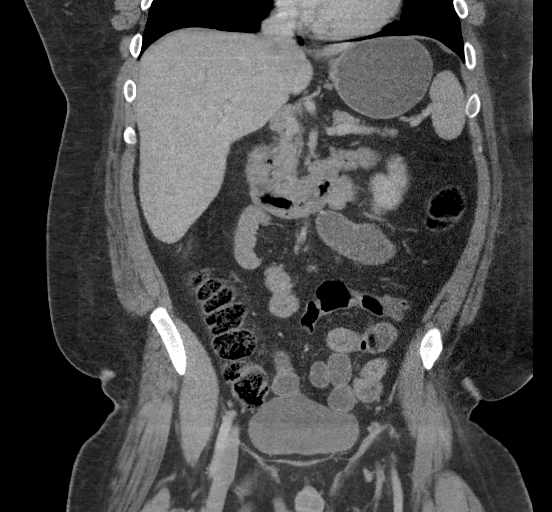
[im 70/126  soft-tissue]
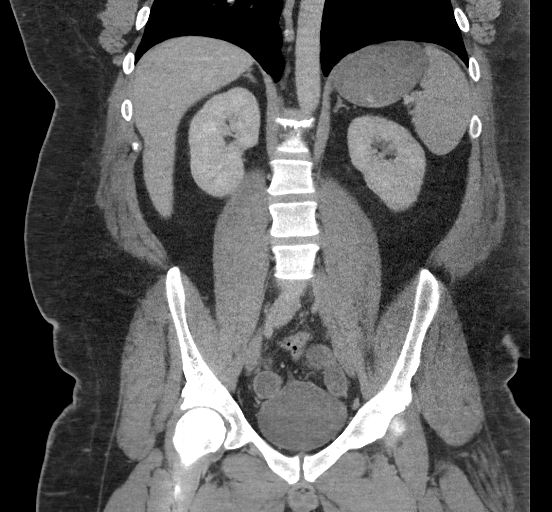

[16 of 46 positions shown; findings below may reference images not displayed]

FINDINGS: Lower chest: No infiltrates or effusions. No worrisome pulmonary
lesions. The heart is normal in size. No pericardial effusion.

Hepatobiliary: No focal hepatic lesions or intrahepatic biliary
dilatation. The gallbladder is mildly distended and there is fairly
significant gallbladder wall thickening and a rim of pericholecystic
edema. There are also numerous gallstones noted. Findings very
suspicious for acute cholecystitis. Recommend right upper quadrant
ultrasound examination for further evaluation. No common bile duct
dilatation.

Pancreas: No mass, inflammation or ductal dilatation.

Spleen: Normal size.  No focal lesions.

Adrenals/Urinary Tract: The adrenal glands and kidneys are
unremarkable. The bladder is normal.

Stomach/Bowel: The stomach, duodenum, small bowel and colon are
unremarkable. No acute inflammatory changes, mass lesions or
obstructive findings. The terminal ileum and appendix are normal.
Evidence of prior small bowel surgery.

Vascular/Lymphatic: The aorta is normal in caliber. No dissection.
The branch vessels are patent. The major venous structures are
patent. No mesenteric or retroperitoneal mass or adenopathy. Small
scattered lymph nodes are noted.

Reproductive: The prostate gland and seminal vesicles are
unremarkable. There is a small amount of fluid in the right inguinal
canal.

Other: Small periumbilical anterior abdominal wall hernia on the
right side containing only omental fat.

Musculoskeletal: No significant bony findings.
IMPRESSION: 1. CT findings suspicious for acute cholecystitis. Recommend
correlation with right upper quadrant ultrasound examination.
2. No other significant or acute abdominal/pelvic findings.

## 2020-10-17 ENCOUNTER — Emergency Department (HOSPITAL_COMMUNITY): Payer: BC Managed Care – PPO

## 2020-10-17 ENCOUNTER — Other Ambulatory Visit: Payer: Self-pay

## 2020-10-17 ENCOUNTER — Encounter (HOSPITAL_COMMUNITY): Payer: Self-pay

## 2020-10-17 ENCOUNTER — Emergency Department (HOSPITAL_COMMUNITY)
Admission: EM | Admit: 2020-10-17 | Discharge: 2020-10-18 | Disposition: A | Discharge status: Home / Self Care | Payer: BC Managed Care – PPO | Attending: Emergency Medicine | Admitting: Emergency Medicine

## 2020-10-17 DIAGNOSIS — M791 Myalgia, unspecified site: Secondary | ICD-10-CM | POA: Diagnosis not present

## 2020-10-17 DIAGNOSIS — Z87891 Personal history of nicotine dependence: Secondary | ICD-10-CM | POA: Diagnosis not present

## 2020-10-17 DIAGNOSIS — R0602 Shortness of breath: Secondary | ICD-10-CM | POA: Diagnosis present

## 2020-10-17 DIAGNOSIS — Z20822 Contact with and (suspected) exposure to covid-19: Secondary | ICD-10-CM | POA: Insufficient documentation

## 2020-10-17 DIAGNOSIS — R509 Fever, unspecified: Secondary | ICD-10-CM | POA: Insufficient documentation

## 2020-10-17 LAB — POC SARS CORONAVIRUS 2 AG -  ED: SARS Coronavirus 2 Ag: NEGATIVE

## 2020-10-17 NOTE — ED Triage Notes (Signed)
Pt presents to ED with c/o chills, body aches, shortness of breath, and dizziness that started this am. Pt reports taking covid test at home and it was negative.

## 2020-10-18 LAB — SARS CORONAVIRUS 2 (TAT 6-24 HRS): SARS Coronavirus 2: NEGATIVE

## 2020-10-18 MED ORDER — AZITHROMYCIN 250 MG PO TABS: 250.0000 mg | ORAL_TABLET | Freq: Every day | ORAL | 0 refills | Status: DC

## 2020-10-18 MED ORDER — AZITHROMYCIN 250 MG PO TABS: 250.0000 mg | ORAL_TABLET | Freq: Every day | ORAL | 0 refills | Status: AC

## 2020-10-18 MED ORDER — AZITHROMYCIN 250 MG PO TABS
500.0000 mg | ORAL_TABLET | Freq: Once | ORAL | Status: AC
  Administered 2020-10-18: 500 mg via ORAL
  Filled 2020-10-18: qty 2
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
## 2020-10-18 NOTE — Discharge Instructions (Addendum)
Covid antigen test negative. Formal PCR test done should be a to review results tomorrow. Still clinically suspicious of Covid infection. Isolate in the meantime. Tylenol as needed for fever. Can also take Motrin as needed for body aches. Return for any new or worse symptoms.  Take the antibiotic azithromycin as directed. First dose given here. Just in case there is a bacterial pneumonia. There was some question of may be some pneumonia left lung base.

## 2020-11-21 ENCOUNTER — Ambulatory Visit: Payer: PRIVATE HEALTH INSURANCE | Attending: Critical Care Medicine

## 2020-11-21 DIAGNOSIS — Z20822 Contact with and (suspected) exposure to covid-19: Secondary | ICD-10-CM

## 2020-11-22 LAB — NOVEL CORONAVIRUS, NAA: SARS-CoV-2, NAA: NOT DETECTED

## 2020-11-22 LAB — SARS-COV-2, NAA 2 DAY TAT

## 2021-11-25 ENCOUNTER — Other Ambulatory Visit: Payer: Self-pay

## 2021-11-25 ENCOUNTER — Encounter (HOSPITAL_COMMUNITY): Payer: Self-pay | Admitting: *Deleted

## 2021-11-25 ENCOUNTER — Emergency Department (HOSPITAL_COMMUNITY)
Admission: EM | Admit: 2021-11-25 | Discharge: 2021-11-25 | Disposition: A | Discharge status: Home / Self Care | Payer: 59 | Attending: Student | Admitting: Student

## 2021-11-25 DIAGNOSIS — S39012A Strain of muscle, fascia and tendon of lower back, initial encounter: Secondary | ICD-10-CM | POA: Insufficient documentation

## 2021-11-25 DIAGNOSIS — Y92410 Unspecified street and highway as the place of occurrence of the external cause: Secondary | ICD-10-CM | POA: Insufficient documentation

## 2021-11-25 DIAGNOSIS — S3992XA Unspecified injury of lower back, initial encounter: Secondary | ICD-10-CM | POA: Diagnosis present

## 2021-11-25 MED ORDER — CYCLOBENZAPRINE HCL 10 MG PO TABS: 10.0000 mg | ORAL_TABLET | Freq: Every evening | ORAL | 0 refills | Status: AC | PRN
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
## 2021-11-25 NOTE — Discharge Instructions (Signed)
I recommend a combination of tylenol and ibuprofen for management of your pain. You can take a low dose of both at the same time. I recommend 500 mg of Tylenol combined with 600 mg of ibuprofen. This is one maximum strength Tylenol and three regular ibuprofen. You can take these 2-3 times for day for your pain. Please try to take these medications with a small amount of food as well to prevent upsetting your stomach. ° °Also, please consider topical pain relieving creams such as Voltaran Gel, BioFreeze, or Icy Hot. There is also a pain relieving cream made by [employer]. You should be able to find all of these at your local pharmacy.  ° °I am prescribing you a strong muscle relaxer called flexeril. Please only take this medication once in the evening with dinner. This medication can make you quite drowsy. Do not mix it with alcohol. Do not drive a vehicle after taking it.  ° °If you develop any new or worsening symptoms please do not hesitate to return to the emergency department. It was a pleasure to meet you. ° °
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
## 2022-04-22 ENCOUNTER — Ambulatory Visit
Admission: EM | Admit: 2022-04-22 | Discharge: 2022-04-22 | Disposition: A | Discharge status: Home / Self Care | Payer: 59 | Attending: Nurse Practitioner | Admitting: Nurse Practitioner

## 2022-04-22 DIAGNOSIS — J069 Acute upper respiratory infection, unspecified: Secondary | ICD-10-CM | POA: Diagnosis not present

## 2022-04-22 LAB — POCT RAPID STREP A (OFFICE): Rapid Strep A Screen: NEGATIVE

## 2022-04-22 MED ORDER — FLUTICASONE PROPIONATE 50 MCG/ACT NA SUSP: 2.0000 | Freq: Every day | NASAL | 0 refills | Status: AC

## 2022-04-22 MED ORDER — CETIRIZINE HCL 10 MG PO TABS: 10.0000 mg | ORAL_TABLET | Freq: Every day | ORAL | 0 refills | Status: AC

## 2022-04-22 MED ORDER — PSEUDOEPH-BROMPHEN-DM 30-2-10 MG/5ML PO SYRP: 5.0000 mL | ORAL_SOLUTION | Freq: Four times a day (QID) | ORAL | 0 refills | Status: AC | PRN

## 2022-04-22 NOTE — ED Triage Notes (Signed)
Pt reports cough, sore throat and chills x 4 days. Reports he do not have sore throat at this moment.
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
## 2022-04-22 NOTE — Discharge Instructions (Addendum)
The rapid strep test was negative.  A throat culture is pending.  You will be contacted if the results of your test are positive to discuss treatment. Take medication as prescribed. Increase fluids and allow for plenty of rest. Recommend Tylenol or ibuprofen as needed for pain, fever, or general discomfort. Warm salt water gargles 3-4 times daily to help with throat pain or discomfort. Recommend using a humidifier at bedtime during sleep to help with cough and nasal congestion. Sleep elevated on 2 pillows while cough symptoms persist. Follow-up if your symptoms do not improve or for any worsening.Marland Kitchen

## 2022-04-25 LAB — CULTURE, GROUP A STREP (THRC)

## 2023-12-29 ENCOUNTER — Other Ambulatory Visit: Payer: Self-pay

## 2023-12-29 ENCOUNTER — Encounter (HOSPITAL_COMMUNITY): Payer: Self-pay

## 2023-12-29 ENCOUNTER — Emergency Department (HOSPITAL_COMMUNITY)
Admission: EM | Admit: 2023-12-29 | Discharge: 2023-12-29 | Disposition: A | Discharge status: Home / Self Care | Attending: Emergency Medicine | Admitting: Emergency Medicine

## 2023-12-29 DIAGNOSIS — S30861A Insect bite (nonvenomous) of abdominal wall, initial encounter: Secondary | ICD-10-CM | POA: Diagnosis present

## 2023-12-29 DIAGNOSIS — W57XXXA Bitten or stung by nonvenomous insect and other nonvenomous arthropods, initial encounter: Secondary | ICD-10-CM | POA: Diagnosis not present

## 2023-12-29 MED ORDER — DOXYCYCLINE HYCLATE 100 MG PO CAPS: 100.0000 mg | ORAL_CAPSULE | Freq: Two times a day (BID) | ORAL | 0 refills | Status: AC

## 2023-12-29 MED ORDER — DOXYCYCLINE HYCLATE 100 MG PO TABS
100.0000 mg | ORAL_TABLET | Freq: Once | ORAL | Status: AC
  Administered 2023-12-29: 100 mg via ORAL
  Filled 2023-12-29: qty 1

## 2023-12-29 NOTE — ED Triage Notes (Signed)
 Pt from homes states that he had a tick bite a week ago, bite is located in central area of abdomen says he didn't start feeling bad until 3 days ago. Complains of fever, chills, generalized soreness and weakness.

## 2023-12-29 NOTE — Discharge Instructions (Signed)
 Given your recent tick exposure and your symptoms you are being placed on antibiotics to cover you for the possibility of a tick infection as discussed.  Take the entire course of this antibiotic.  Get rechecked if you have any new or worsening symptoms.  You may take Tylenol  or Motrin for fever and bodyaches.

## 2023-12-30 NOTE — ED Provider Notes (Signed)
 [place] EMERGENCY DEPARTMENT AT [facility] Provider Note   CSN: [id number] Arrival date & time: 12/29/23  1745     History  Chief Complaint  Patient presents with   Tick Removal    Gordon [name] is a 43 y.o. male presenting for evaluation of symptoms including generalized bodyaches, low-grade fever, chills with generalized myalgias and fatigue x 3 days.  He found an embedded tick on his abdomen 1 week ago and has developed erythema and itching at the site of the tick, he removed it completely including the "head".  He denies rash, headache, photophobia, neck pain or stiffness.  He has had no cough, congestion, shortness of breath, also denies nausea, vomiting, abdominal pain, dysuria, diarrhea.  He has had no exposures to others with similar symptoms.   The history is provided by the patient.       Home Medications Prior to Admission medications   Medication Sig Start Date End Date Taking? Authorizing Provider  doxycycline  (VIBRAMYCIN ) 100 MG capsule Take 1 capsule (100 mg total) by mouth 2 (two) times daily. 12/29/23  Yes Dorin Stooksbury, PA-C  azithromycin  (ZITHROMAX ) 250 MG tablet Take 1 tablet (250 mg total) by mouth daily. Take first 2 tablets together, then 1 every day until finished. 10/18/20   Zackowski, Scott, MD  benzonatate  (TESSALON ) 100 MG capsule Take 1 capsule (100 mg total) by mouth every 8 (eight) hours. 01/29/20   Avegno, Komlanvi S, FNP  brompheniramine-pseudoephedrine-DM 30-2-10 MG/5ML syrup Take 5 mLs by mouth 4 (four) times daily as needed. 04/22/22   Leath-Warren, Belen Bowers, NP  cetirizine  (ZYRTEC ) 10 MG tablet Take 1 tablet (10 mg total) by mouth daily. 04/22/22   Leath-Warren, Belen Bowers, NP  cyclobenzaprine  (FLEXERIL ) 10 MG tablet Take 1 tablet (10 mg total) by mouth at bedtime as needed for muscle spasms. 11/25/21   Joldersma, Logan, PA-C  fluticasone  (FLONASE ) 50 MCG/ACT nasal spray Place 2 sprays into both nostrils daily. 04/22/22   Leath-Warren,  Belen Bowers, NP  ondansetron  (ZOFRAN ) 4 MG tablet Take 1 tablet (4 mg total) by mouth every 6 (six) hours. 03/14/20   Wellington Half, FNP      Allergies    Patient has no known allergies.    Review of Systems   Review of Systems  Constitutional:  Positive for chills and fever.  HENT:  Negative for congestion and sore throat.   Eyes: Negative.   Respiratory:  Negative for chest tightness and shortness of breath.   Cardiovascular:  Negative for chest pain.  Gastrointestinal:  Positive for nausea. Negative for abdominal pain, diarrhea and vomiting.  Genitourinary: Negative.   Musculoskeletal:  Positive for myalgias. Negative for arthralgias, joint swelling and neck pain.  Skin: Negative.  Negative for color change, rash and wound.  Neurological:  Negative for dizziness, weakness, light-headedness, numbness and headaches.  Psychiatric/Behavioral: Negative.      Physical Exam Updated Vital Signs BP (!) 140/85   Pulse 90   Temp 100 F (37.8 C)   Resp 18   Ht 5\' 11"  (1.803 m)   Wt 136.1 kg   SpO2 96%   BMI 41.84 kg/m  Physical Exam Vitals and nursing note reviewed.  Constitutional:      Appearance: He is well-developed.  HENT:     Head: Normocephalic and atraumatic.  Eyes:     Conjunctiva/sclera: Conjunctivae normal.  Cardiovascular:     Rate and Rhythm: Normal rate and regular rhythm.     Heart sounds: Normal heart  sounds.  Pulmonary:     Effort: Pulmonary effort is normal.     Breath sounds: Normal breath sounds. No wheezing.  Abdominal:     General: Bowel sounds are normal.     Palpations: Abdomen is soft.     Tenderness: There is no abdominal tenderness.  Musculoskeletal:        General: Normal range of motion.     Cervical back: Normal range of motion.  Skin:    General: Skin is warm and dry.     Findings: Erythema present.     Comments: Small macular circle of erythema right upper abdomen at site of tick bite.  No bull's-eye pattern.  No rashes noted.   Neurological:     General: No focal deficit present.     Mental Status: He is alert.     ED Results / Procedures / Treatments   Labs (all labs ordered are listed, but only abnormal results are displayed) Labs Reviewed - No data to display  EKG None  Radiology No results found.  Procedures Procedures    Medications Ordered in ED Medications  doxycycline  (VIBRA -TABS) tablet 100 mg (100 mg Oral Given 12/29/23 2138)    ED Course/ Medical Decision Making/ A&P                                 Medical Decision Making Patient presenting with flulike symptoms including myalgias, fever, fatigue starting a week after he had a significant tick bite to his abdomen.  We discussed pros and cons of running tick titer lab work, although as these tests will take at least a week to result, would not delay antibiotic treatment given his current symptoms.  He does not have any respiratory or GI symptoms, unlikely that his fever represents influenza or COVID.  He is started on doxycycline  twice daily with first dose given here.  He was advised to take Tylenol  or ibuprofen for symptom relief.  Return precautions were outlined for any new or worsening symptoms.  He was strongly advised that he needs to take the entire course of the antibiotics even if he starts feeling better sooner, patient understands this plan.  Risk Prescription drug management.           Final Clinical Impression(s) / ED Diagnoses Final diagnoses:  Tick bite of abdomen, initial encounter    Rx / DC Orders ED Discharge Orders          Ordered    doxycycline  (VIBRAMYCIN ) 100 MG capsule  2 times daily        12/29/23 2212              Nahzir Pohle, PA-C 12/30/23 2106    Iva Mariner, MD 01/04/24 0002

## 2024-08-10 ENCOUNTER — Ambulatory Visit: Payer: Self-pay | Admitting: Family Medicine

## 2024-11-20 ENCOUNTER — Ambulatory Visit
